# Patient Record
Sex: Female | Born: 1990 | Race: White | Hispanic: No | Marital: Single | Smoking: Former smoker
Health system: Southern US, Community
[De-identification: ages and names within clinical notes are randomized; demographics above are authoritative.]

## PROBLEM LIST (undated history)

## (undated) ENCOUNTER — Inpatient Hospital Stay (HOSPITAL_COMMUNITY): Payer: Self-pay

## (undated) DIAGNOSIS — F429 Obsessive-compulsive disorder, unspecified: Secondary | ICD-10-CM

## (undated) DIAGNOSIS — N200 Calculus of kidney: Secondary | ICD-10-CM

## (undated) DIAGNOSIS — F32A Depression, unspecified: Secondary | ICD-10-CM

## (undated) DIAGNOSIS — K8020 Calculus of gallbladder without cholecystitis without obstruction: Secondary | ICD-10-CM

## (undated) DIAGNOSIS — F329 Major depressive disorder, single episode, unspecified: Secondary | ICD-10-CM

## (undated) DIAGNOSIS — F419 Anxiety disorder, unspecified: Secondary | ICD-10-CM

## (undated) HISTORY — PX: TONSILLECTOMY AND ADENOIDECTOMY: SUR1326

---

## 2015-10-11 ENCOUNTER — Encounter (HOSPITAL_COMMUNITY): Payer: Self-pay

## 2015-10-11 ENCOUNTER — Inpatient Hospital Stay (HOSPITAL_COMMUNITY)
Admission: AD | Admit: 2015-10-11 | Discharge: 2015-10-11 | Disposition: A | Discharge status: Home / Self Care | Payer: Medicaid Other | Source: Ambulatory Visit | Attending: Family Medicine | Admitting: Family Medicine

## 2015-10-11 DIAGNOSIS — Z3202 Encounter for pregnancy test, result negative: Secondary | ICD-10-CM | POA: Diagnosis not present

## 2015-10-11 DIAGNOSIS — F17210 Nicotine dependence, cigarettes, uncomplicated: Secondary | ICD-10-CM | POA: Diagnosis not present

## 2015-10-11 DIAGNOSIS — N898 Other specified noninflammatory disorders of vagina: Secondary | ICD-10-CM | POA: Diagnosis not present

## 2015-10-11 DIAGNOSIS — N6019 Diffuse cystic mastopathy of unspecified breast: Secondary | ICD-10-CM | POA: Insufficient documentation

## 2015-10-11 DIAGNOSIS — R1032 Left lower quadrant pain: Secondary | ICD-10-CM

## 2015-10-11 DIAGNOSIS — R1030 Lower abdominal pain, unspecified: Secondary | ICD-10-CM | POA: Diagnosis not present

## 2015-10-11 DIAGNOSIS — N63 Unspecified lump in breast: Secondary | ICD-10-CM | POA: Diagnosis present

## 2015-10-11 HISTORY — DX: Obsessive-compulsive disorder, unspecified: F42.9

## 2015-10-11 HISTORY — DX: Calculus of gallbladder without cholecystitis without obstruction: K80.20

## 2015-10-11 HISTORY — DX: Major depressive disorder, single episode, unspecified: F32.9

## 2015-10-11 HISTORY — DX: Anxiety disorder, unspecified: F41.9

## 2015-10-11 HISTORY — DX: Depression, unspecified: F32.A

## 2015-10-11 LAB — URINALYSIS, ROUTINE W REFLEX MICROSCOPIC
BILIRUBIN URINE: NEGATIVE
Glucose, UA: NEGATIVE mg/dL
HGB URINE DIPSTICK: NEGATIVE
Ketones, ur: NEGATIVE mg/dL
Leukocytes, UA: NEGATIVE
NITRITE: NEGATIVE
PROTEIN: NEGATIVE mg/dL
SPECIFIC GRAVITY, URINE: 1.01 (ref 1.005–1.030)
pH: 5.5 (ref 5.0–8.0)

## 2015-10-11 LAB — WET PREP, GENITAL
Clue Cells Wet Prep HPF POC: NONE SEEN
Sperm: NONE SEEN
Trich, Wet Prep: NONE SEEN
Yeast Wet Prep HPF POC: NONE SEEN

## 2015-10-11 LAB — POCT PREGNANCY, URINE: PREG TEST UR: NEGATIVE

## 2015-10-11 NOTE — MAU Note (Signed)
Sharp LLQ pain & lower abd pressure x 2 weeks, is getting worse.  Also feels "leaking sensation", unsure if she is voiding or if this is vaginal discharge.  Lump in L breast, is sore.

## 2015-10-11 NOTE — MAU Provider Note (Signed)
History     CSN: [id number]  Arrival date and time: 10/11/15 0917   First Provider Initiated Contact with Patient 10/11/15 [phone]      Chief Complaint  Patient presents with  . Abdominal Pain  . Breast Mass   HPI  Krystal Garrett 24 y.o. G1P1000 presents to the MAU with c/o of a small breast lump that she noticed yesterday and was concerned. Denies ever noticing any before. She also is complaining of lower abdominal pressure and vaginal discharge accompanied by occasional left LLQ sharp pain that has been happening for 3 weeks. She just got her insurance card so that is why she came in today.  Past Medical History  Diagnosis Date  . Gallstone   . Anxiety   . Depression   . OCD (obsessive compulsive disorder)     Past Surgical History  Procedure Laterality Date  . Tonsillectomy and adenoidectomy      as a child    Family History  Problem Relation Age of Onset  . Hypertension Mother   . Diabetes Mother   . Hypertension Father   . Cancer Maternal Grandmother   . Diabetes Maternal Grandmother   . Hypertension Maternal Grandmother     Social History  Substance Use Topics  . Smoking status: Current Every Day Smoker -- 0.50 packs/day    Types: Cigarettes  . Smokeless tobacco: Never Used  . Alcohol Use: No    Allergies: No Known Allergies  No prescriptions prior to admission    Review of Systems  Constitutional: Negative for fever.  Gastrointestinal: Positive for abdominal pain.  Genitourinary: Positive for dysuria.       Vaginal discharge  All other systems reviewed and are negative.  Physical Exam   Blood pressure 114/73, pulse 86, temperature 98.1 F (36.7 C), resp. rate 18, height 5\' 6"  (1.676 m), weight 63.504 kg (140 lb), last menstrual period 09/15/2015.  Physical Exam  Nursing note and vitals reviewed. Constitutional: She is oriented to person, place, and time. She appears well-developed and well-nourished. No distress.  HENT:  Head: Normocephalic  and atraumatic.  Cardiovascular: Normal rate.   Respiratory: Effort normal and breath sounds normal. No respiratory distress. Left breast exhibits tenderness.    Small  0.5 breast nodule probably fibrocystic breast  GI: Soft. There is tenderness.  Suprapubic   Genitourinary: Vaginal discharge found.  Clear discharge  Musculoskeletal: Normal range of motion.  Neurological: She is alert and oriented to person, place, and time.  Skin: Skin is warm and dry.  Psychiatric: She has a normal mood and affect. Her behavior is normal. Judgment and thought content normal.   Results for orders placed or performed during the hospital encounter of 10/11/15 (from the past 24 hour(s))  Urinalysis, Routine w reflex microscopic (not at [facility])     Status: None   Collection Time: 10/11/15  9:30 AM  Result Value Ref Range   Color, Urine YELLOW YELLOW   APPearance CLEAR CLEAR   Specific Gravity, Urine 1.010 1.005 - 1.030   pH 5.5 5.0 - 8.0   Glucose, UA NEGATIVE NEGATIVE mg/dL   Hgb urine dipstick NEGATIVE NEGATIVE   Bilirubin Urine NEGATIVE NEGATIVE   Ketones, ur NEGATIVE NEGATIVE mg/dL   Protein, ur NEGATIVE NEGATIVE mg/dL   Nitrite NEGATIVE NEGATIVE   Leukocytes, UA NEGATIVE NEGATIVE  Wet prep, genital     Status: Abnormal   Collection Time: 10/11/15  9:55 AM  Result Value Ref Range   Yeast Wet Prep HPF POC NONE  SEEN NONE SEEN   Trich, Wet Prep NONE SEEN NONE SEEN   Clue Cells Wet Prep HPF POC NONE SEEN NONE SEEN   WBC, Wet Prep HPF POC FEW (A) NONE SEEN   Sperm NONE SEEN   Pregnancy, urine POC     Status: None   Collection Time: 10/11/15  9:57 AM  Result Value Ref Range   Preg Test, Ur NEGATIVE NEGATIVE   MAU Course  Procedures  MDM All labs normal today will wait for GC /CH results and treat appropriately if needed. Told to monitor breast nodule and if persists she will need to follow up with obgyn. She is agreeable to recommendation. Told her that it was probably fibrocystic. Assessment  and Plan  Vaginal discharge Lower abdominal pain Fibrocystic Breast   Discharge   Krystal Garrett 10/11/2015, 10:01 AM

## 2015-10-11 NOTE — Discharge Instructions (Signed)
Chlamydia, Female Chlamydia is an infection. It is spread from one person to another person during sexual contact. This infection can be in the cervix, urine tube (urethra), throat, or bottom (rectum). This infection needs treatment. HOME CARE   Take your medicines (antibiotics) as told. Finish them even if you start to feel better.  Only take medicine as told by your doctor.  Tell your sex partner(s) that you have chlamydia. They must also be treated.  Do not have sex until your doctor says it is okay.  Rest.  Eat healthy. Drink enough fluids to keep your pee (urine) clear or pale yellow.  Keep all doctor visits as told. GET HELP IF:  You have pain when you pee.  You have belly pain.  You have vaginal discharge.  You have pain during sex.  You have bleeding between periods and after sex.  You have a fever. GET HELP RIGHT AWAY IF:   You feel sick to your stomach (nauseous) or you throw up (vomit).  You sweat much more than normal (diaphoresis).  You have trouble swallowing.   This information is not intended to replace advice given to you by your health care provider. Make sure you discuss any questions you have with your health care provider.   Document Released: 07/31/2008 Document Revised: 07/13/2015 Document Reviewed: 06/29/2013 Elsevier Interactive Patient Education 2016 [place] Gonorrhea is a bacterial infection that spreads from person to person through sexual contact. It can also spread from a mother to her baby during childbirth. You may be tested for gonorrhea if:  You have symptoms of gonorrhea.  You are pregnant or plan to get pregnant.  You have multiple sexual partners. There are three gonorrhea tests:  Nucleic acid amplification test. For this test, you will provide a urine sample or your health care provider will use a swab to collect a fluid sample from the area of possible infection.  Nucleic acid hybridization test. For  this test, your health care provider will use a swab to collect a fluid sample from the penis or vagina.  Gonorrhea culture. For this test, your health care provider will use a swab to collect a sample of bodily fluid from the area of possible infection. This test may be done in cases of sexual assault for legal reasons. In all three methods of testing, the sample is sent to a laboratory for analysis. PREPARATION FOR TEST  Let your health care provider know if you are taking antibiotic medicine.  Do not use vaginal creams or douches.  Try to arrive with a full bladder. RESULTS It is your responsibility to obtain your test results. Ask the lab or department performing the test when and how you will get your results. Contact your health care provider to discuss any questions you have about your results. The results of your gonorrhea test will either be negative or positive. Meaning of Negative Test Results If your test result is negative, then it is most likely that you do not have the disease. Meaning of Positive Test Results If your test result is positive, you have an active gonorrhea infection. Notify any sexual partners so that they can also be tested.   This information is not intended to replace advice given to you by your health care provider. Make sure you discuss any questions you have with your health care provider.   Document Released: 11/23/2004 Document Revised: 11/12/2014 Document Reviewed: 01/25/2014 Elsevier Interactive Patient Education 2016 [employer] Fibrocystic Breast Changes Fibrocystic  breast changes happen when tiny sacs filled with fluid form in the breast. They are not cancer. They can feel like lumps. HOME CARE  Check your breasts after every menstrual period or the first day of every month if you do not have menstrual periods. Check for:  Soreness.  New puffiness (swelling).  A change in breast size.  A change in a lump that was already there.  Only  take medicine as told by your doctor.  Wear a support or sports bra that fits well.  Avoid caffeine in pop, chocolate, coffee, and tea. GET HELP IF:   You have fluid coming from your nipples, especially if it is bloody.  You have new lumps or bumps in your breast.  Your breast becomes puffy, red, and painful.  You have changes in how your breast looks.  Your nipples look flat or sunk in.   This information is not intended to replace advice given to you by your health care provider. Make sure you discuss any questions you have with your health care provider.   Document Released: 10/04/2008 Document Revised: 10/27/2013 Document Reviewed: 04/12/2013 Elsevier Interactive Patient Education [employer].

## 2015-10-12 LAB — GC/CHLAMYDIA PROBE AMP (~~LOC~~) NOT AT ARMC
Chlamydia: NEGATIVE
Neisseria Gonorrhea: NEGATIVE

## 2015-10-19 ENCOUNTER — Emergency Department (HOSPITAL_COMMUNITY): Payer: Medicaid Other

## 2015-10-19 ENCOUNTER — Emergency Department (HOSPITAL_COMMUNITY)
Admission: EM | Admit: 2015-10-19 | Discharge: 2015-10-19 | Disposition: A | Discharge status: Home / Self Care | Payer: Medicaid Other | Attending: Emergency Medicine | Admitting: Emergency Medicine

## 2015-10-19 ENCOUNTER — Encounter (HOSPITAL_COMMUNITY): Payer: Self-pay | Admitting: Emergency Medicine

## 2015-10-19 DIAGNOSIS — F17210 Nicotine dependence, cigarettes, uncomplicated: Secondary | ICD-10-CM | POA: Insufficient documentation

## 2015-10-19 DIAGNOSIS — R319 Hematuria, unspecified: Secondary | ICD-10-CM

## 2015-10-19 DIAGNOSIS — R42 Dizziness and giddiness: Secondary | ICD-10-CM | POA: Diagnosis not present

## 2015-10-19 DIAGNOSIS — Z8659 Personal history of other mental and behavioral disorders: Secondary | ICD-10-CM | POA: Insufficient documentation

## 2015-10-19 DIAGNOSIS — Z8719 Personal history of other diseases of the digestive system: Secondary | ICD-10-CM | POA: Insufficient documentation

## 2015-10-19 DIAGNOSIS — Z3202 Encounter for pregnancy test, result negative: Secondary | ICD-10-CM | POA: Diagnosis not present

## 2015-10-19 DIAGNOSIS — N200 Calculus of kidney: Secondary | ICD-10-CM | POA: Diagnosis not present

## 2015-10-19 DIAGNOSIS — R112 Nausea with vomiting, unspecified: Secondary | ICD-10-CM | POA: Diagnosis present

## 2015-10-19 LAB — COMPREHENSIVE METABOLIC PANEL
ALT: 13 U/L — ABNORMAL LOW (ref 14–54)
AST: 16 U/L (ref 15–41)
Albumin: 4.3 g/dL (ref 3.5–5.0)
Alkaline Phosphatase: 56 U/L (ref 38–126)
Anion gap: 9 (ref 5–15)
BUN: 8 mg/dL (ref 6–20)
CO2: 24 mmol/L (ref 22–32)
Calcium: 9.1 mg/dL (ref 8.9–10.3)
Chloride: 106 mmol/L (ref 101–111)
Creatinine, Ser: 0.92 mg/dL (ref 0.44–1.00)
GFR calc Af Amer: >60 mL/min (ref >60)
GFR calc non Af Amer: >60 mL/min (ref >60)
Glucose, Bld: 108 mg/dL — ABNORMAL HIGH (ref 65–99)
Potassium: 3.7 mmol/L (ref 3.5–5.1)
Sodium: 139 mmol/L (ref 135–145)
Total Bilirubin: 0.5 mg/dL (ref 0.3–1.2)
Total Protein: 6.5 g/dL (ref 6.5–8.1)

## 2015-10-19 LAB — URINALYSIS, ROUTINE W REFLEX MICROSCOPIC
GLUCOSE, UA: NEGATIVE mg/dL
Ketones, ur: 15 mg/dL — AB
Nitrite: POSITIVE — AB
PH: 5 (ref 5.0–8.0)
Protein, ur: 100 mg/dL — AB
SPECIFIC GRAVITY, URINE: 1.036 — AB (ref 1.005–1.030)

## 2015-10-19 LAB — CBC
HCT: 41.4 % (ref 36.0–46.0)
Hemoglobin: 14.1 g/dL (ref 12.0–15.0)
MCH: 31.4 pg (ref 26.0–34.0)
MCHC: 34.1 g/dL (ref 30.0–36.0)
MCV: 92.2 fL (ref 78.0–100.0)
Platelets: 301 10*3/uL (ref 150–400)
RBC: 4.49 MIL/uL (ref 3.87–5.11)
RDW: 12.4 % (ref 11.5–15.5)
WBC: 9.6 10*3/uL (ref 4.0–10.5)

## 2015-10-19 LAB — URINE MICROSCOPIC-ADD ON: Bacteria, UA: NONE SEEN

## 2015-10-19 LAB — POC URINE PREG, ED: Preg Test, Ur: NEGATIVE

## 2015-10-19 MED ORDER — ONDANSETRON 4 MG PO TBDP
4.0000 mg | ORAL_TABLET | Freq: Once | ORAL | Status: AC
  Administered 2015-10-19: 4 mg via ORAL
  Filled 2015-10-19: qty 1

## 2015-10-19 MED ORDER — HYDROCODONE-ACETAMINOPHEN 5-325 MG PO TABS: 1.0000 | ORAL_TABLET | Freq: Four times a day (QID) | ORAL | Status: DC | PRN

## 2015-10-19 MED ORDER — FENTANYL CITRATE (PF) 100 MCG/2ML IJ SOLN
50.0000 ug | Freq: Once | INTRAMUSCULAR | Status: AC
  Administered 2015-10-19: 50 ug via INTRAMUSCULAR
  Filled 2015-10-19: qty 2

## 2015-10-19 MED ORDER — ONDANSETRON 4 MG PO TBDP: 4.0000 mg | ORAL_TABLET | Freq: Three times a day (TID) | ORAL | Status: DC | PRN

## 2015-10-19 MED ORDER — FOSFOMYCIN TROMETHAMINE 3 G PO PACK
3.0000 g | PACK | Freq: Once | ORAL | Status: AC
  Administered 2015-10-19: 3 g via ORAL
  Filled 2015-10-19: qty 3

## 2015-10-19 NOTE — ED Provider Notes (Signed)
I saw and evaluated the patient, reviewed the resident's note and I agree with the findings and plan.  Back pain and abdominal pain similar to previous kidney stones. Appears well. Abdomen benign, doubt intraabdominal pathology. Plan to work up for nephrolithiasis.   Merrily Pew, MD 10/19/15 [phone]

## 2015-10-19 NOTE — ED Provider Notes (Signed)
CSN: [id number]     Arrival date & time 10/19/15  1929 History   First MD Initiated Contact with Patient 10/19/15 2112     Chief Complaint  Patient presents with  . Emesis  . Dizziness    Patient is a 24 y.o. female presenting with vomiting. The history is provided by the patient.  Emesis Severity:  Moderate Duration:  2 days Timing:  Constant Progression:  Worsening Chronicity:  New Relieved by:  Nothing Associated symptoms: abdominal pain   Associated symptoms: no fever, no headaches and no sore throat   Associated symptoms comment:  Nausea and lightheadedness   Past Medical History  Diagnosis Date  . Gallstone   . Anxiety   . Depression   . OCD (obsessive compulsive disorder)    Past Surgical History  Procedure Laterality Date  . Tonsillectomy and adenoidectomy      as a child   Family History  Problem Relation Age of Onset  . Hypertension Mother   . Diabetes Mother   . Hypertension Father   . Cancer Maternal Grandmother   . Diabetes Maternal Grandmother   . Hypertension Maternal Grandmother    Social History  Substance Use Topics  . Smoking status: Current Every Day Smoker -- 0.00 packs/day    Types: Cigarettes  . Smokeless tobacco: Never Used  . Alcohol Use: No   OB History    Gravida Para Term Preterm AB TAB SAB Ectopic Multiple Living   1 1 1             Review of Systems  Constitutional: Negative for fever.  HENT: Negative for rhinorrhea and sore throat.   Eyes: Negative for visual disturbance.  Respiratory: Negative for chest tightness and shortness of breath.   Cardiovascular: Negative for chest pain and palpitations.  Gastrointestinal: Positive for nausea, vomiting and abdominal pain. Negative for constipation.  Genitourinary: Positive for flank pain and vaginal bleeding (currently menstruating). Negative for dysuria and hematuria.  Musculoskeletal: Negative for back pain and neck pain.  Skin: Negative for rash.  Neurological: Positive for  light-headedness. Negative for dizziness and headaches.  Psychiatric/Behavioral: Negative for confusion.  All other systems reviewed and are negative.  Allergies  Review of patient's allergies indicates no known allergies.  Home Medications   Prior to Admission medications   Medication Sig Start Date End Date Taking? Authorizing Provider  Melatonin 3 MG TABS Take 3 mg by mouth at bedtime as needed (sleep).   Yes Historical Provider, MD  HYDROcodone-acetaminophen (NORCO/VICODIN) 5-325 MG tablet Take 1 tablet by mouth every 6 (six) hours as needed for moderate pain. 10/19/15   Gustavus Bryant, MD  ondansetron (ZOFRAN ODT) 4 MG disintegrating tablet Take 1 tablet (4 mg total) by mouth every 8 (eight) hours as needed for nausea or vomiting. 10/19/15   Gustavus Bryant, MD   BP 115/77 mmHg  Pulse 71  Temp(Src) 98.4 F (36.9 C) (Oral)  Resp 16  SpO2 100%  LMP 10/12/2015 Physical Exam  Constitutional: She is oriented to person, place, and time. She appears well-developed and well-nourished. No distress.  HENT:  Head: Normocephalic and atraumatic.  Mouth/Throat: Oropharynx is clear and moist.  Eyes: EOM are normal. Pupils are equal, round, and reactive to light.  Neck: Neck supple. No JVD present.  Cardiovascular: Normal rate, regular rhythm, normal heart sounds and intact distal pulses.  Exam reveals no gallop.   No murmur heard. Pulmonary/Chest: Effort normal and breath sounds normal. She has no wheezes. She has no rales.  Abdominal: Soft. She exhibits no distension. There is tenderness in the suprapubic area. There is CVA tenderness (R>L).  Musculoskeletal: Normal range of motion. She exhibits no tenderness.  Neurological: She is alert and oriented to person, place, and time. No cranial nerve deficit. She exhibits normal muscle tone.  Skin: Skin is warm and dry. No rash noted.  Psychiatric: Her behavior is normal.    ED Course  Procedures none  Labs Review Labs Reviewed   COMPREHENSIVE METABOLIC PANEL - Abnormal; Notable for the following:    Glucose, Bld 108 (*)    ALT 13 (*)    All other components within normal limits  URINALYSIS, ROUTINE W REFLEX MICROSCOPIC (NOT AT [facility]) - Abnormal; Notable for the following:    Color, Urine RED (*)    APPearance TURBID (*)    Specific Gravity, Urine 1.036 (*)    Hgb urine dipstick LARGE (*)    Bilirubin Urine MODERATE (*)    Ketones, ur 15 (*)    Protein, ur 100 (*)    Nitrite POSITIVE (*)    Leukocytes, UA SMALL (*)    All other components within normal limits  URINE MICROSCOPIC-ADD ON - Abnormal; Notable for the following:    Squamous Epithelial / LPF 0-5 (*)    Crystals CA OXALATE CRYSTALS (*)    All other components within normal limits  CBC  POC URINE PREG, ED    Imaging Review Ct Renal Stone Study  10/19/2015  CLINICAL DATA:  Abdominal pain. Vomiting. Hematuria. History kidney stones. EXAM: CT ABDOMEN AND PELVIS WITHOUT CONTRAST TECHNIQUE: Multidetector CT imaging of the abdomen and pelvis was performed following the standard protocol without IV contrast. COMPARISON:  None. FINDINGS: Lower chest: Clear lung bases. Normal heart size without pericardial or pleural effusion. Hepatobiliary: Normal liver. Normal gallbladder, without biliary ductal dilatation. Pancreas: Normal, without mass or ductal dilatation. Spleen: Normal in size, without focal abnormality. Adrenals/Urinary Tract: Normal adrenal glands. Malrotated left kidney. Bilateral renal collecting system calculi. No hydronephrosis. Calcifications in the pelvis which are all felt to be vascular. No right and no convincing evidence of left ureteric stent. No bladder calculi. Stomach/Bowel: Normal stomach, without wall thickening. Normal colon, appendix, and terminal ileum. Normal small bowel. Vascular/Lymphatic: Normal caliber of the aorta and branch vessels. No pelvic adenopathy. Reproductive: Normal uterus and adnexa. Other: No significant free fluid.  Musculoskeletal: No acute osseous abnormality. IMPRESSION: 1. Bilateral nephrolithiasis. Malrotated left kidney. No obstructive uropathy and no convincing evidence of ureteric stone. Multiple vascular calcifications throughout the pelvis. 2. No other explanation for abdominal pain. Electronically Signed   By: Abigail Miyamoto M.D.   On: 10/19/2015 22:28   I have personally reviewed and evaluated these images and lab results as part of my medical decision-making.   MDM   Final diagnoses:  Hematuria  Nephrolithiasis    24 year old female with a history of kidney stones and UTIs presents with emesis, dizziness. Onset yesterday. UA shows Large blood with +nitrites and no bacteria. UPT neg. H/o gallstones. Ca oxalate crystals on UA. History of remote kidney stones and UTIs.  She is symptomatic with some renal urination. We'll give fosfomycin here and obtain CT without contrast to evaluate for stone. IM fentanyl and ODT Zofran.  CT shows bilateral nephrolithiasis without evidence of infection or hydronephrosis. We will discharge home with prescription for Zofran, Norco and contact information to follow up with urology. Patient wishes understanding and is agreeable with this plan. She ran C mechanically stable and her symptoms have  improved in the ED.   Discussed with Dr. Dayna Barker.   Gustavus Bryant, MD 10/19/15 [id number]  Merrily Pew, MD 10/20/15 [phone]

## 2015-10-19 NOTE — ED Notes (Signed)
Patient transported to CT 

## 2015-10-19 NOTE — ED Notes (Signed)
Pt. reports nausea , emesis and dizziness " blacking out" onset this afternoon , denies fever or diarrhea .

## 2015-10-19 NOTE — ED Notes (Signed)
Pt c/o NV with lower abdominal cramping onset today. Denies urinary problems. Hx of kidney stones in the past

## 2016-08-12 ENCOUNTER — Encounter (HOSPITAL_COMMUNITY): Payer: Self-pay | Admitting: Emergency Medicine

## 2016-08-12 ENCOUNTER — Emergency Department (HOSPITAL_COMMUNITY): Payer: Medicaid Other

## 2016-08-12 ENCOUNTER — Emergency Department (HOSPITAL_COMMUNITY)
Admission: EM | Admit: 2016-08-12 | Discharge: 2016-08-13 | Disposition: A | Discharge status: Home / Self Care | Payer: Medicaid Other | Attending: Emergency Medicine | Admitting: Emergency Medicine

## 2016-08-12 DIAGNOSIS — R109 Unspecified abdominal pain: Secondary | ICD-10-CM

## 2016-08-12 DIAGNOSIS — R1011 Right upper quadrant pain: Secondary | ICD-10-CM | POA: Diagnosis not present

## 2016-08-12 DIAGNOSIS — F17210 Nicotine dependence, cigarettes, uncomplicated: Secondary | ICD-10-CM | POA: Insufficient documentation

## 2016-08-12 DIAGNOSIS — R112 Nausea with vomiting, unspecified: Secondary | ICD-10-CM

## 2016-08-12 HISTORY — DX: Calculus of kidney: N20.0

## 2016-08-12 LAB — CBC
HEMATOCRIT: 41.1 % (ref 36.0–46.0)
HEMOGLOBIN: 13.4 g/dL (ref 12.0–15.0)
MCH: 31 pg (ref 26.0–34.0)
MCHC: 32.6 g/dL (ref 30.0–36.0)
MCV: 95.1 fL (ref 78.0–100.0)
Platelets: 300 10*3/uL (ref 150–400)
RBC: 4.32 MIL/uL (ref 3.87–5.11)
RDW: 12.3 % (ref 11.5–15.5)
WBC: 8.3 10*3/uL (ref 4.0–10.5)

## 2016-08-12 LAB — URINALYSIS, ROUTINE W REFLEX MICROSCOPIC
BILIRUBIN URINE: NEGATIVE
Glucose, UA: NEGATIVE mg/dL
Hgb urine dipstick: NEGATIVE
Ketones, ur: NEGATIVE mg/dL
NITRITE: NEGATIVE
PH: 6.5 (ref 5.0–8.0)
Protein, ur: NEGATIVE mg/dL

## 2016-08-12 LAB — URINE MICROSCOPIC-ADD ON

## 2016-08-12 LAB — COMPREHENSIVE METABOLIC PANEL
ALBUMIN: 4 g/dL (ref 3.5–5.0)
ALT: 15 U/L (ref 14–54)
ANION GAP: 7 (ref 5–15)
AST: 19 U/L (ref 15–41)
Alkaline Phosphatase: 48 U/L (ref 38–126)
BILIRUBIN TOTAL: 0.4 mg/dL (ref 0.3–1.2)
BUN: 9 mg/dL (ref 6–20)
CO2: 28 mmol/L (ref 22–32)
Calcium: 9.3 mg/dL (ref 8.9–10.3)
Chloride: 106 mmol/L (ref 101–111)
Creatinine, Ser: 0.92 mg/dL (ref 0.44–1.00)
GFR calc Af Amer: >60 mL/min (ref >60)
GFR calc non Af Amer: >60 mL/min (ref >60)
GLUCOSE: 82 mg/dL (ref 65–99)
POTASSIUM: 4.1 mmol/L (ref 3.5–5.1)
SODIUM: 141 mmol/L (ref 135–145)
TOTAL PROTEIN: 6.2 g/dL — AB (ref 6.5–8.1)

## 2016-08-12 LAB — LIPASE, BLOOD: LIPASE: 45 U/L (ref 11–51)

## 2016-08-12 LAB — POC URINE PREG, ED: Preg Test, Ur: NEGATIVE

## 2016-08-12 MED ORDER — KETOROLAC TROMETHAMINE 30 MG/ML IJ SOLN
30.0000 mg | Freq: Once | INTRAMUSCULAR | Status: AC
  Administered 2016-08-12: 30 mg via INTRAVENOUS
  Filled 2016-08-12: qty 1

## 2016-08-12 MED ORDER — SODIUM CHLORIDE 0.9 % IV BOLUS (SEPSIS)
1000.0000 mL | Freq: Once | INTRAVENOUS | Status: AC
  Administered 2016-08-12: 1000 mL via INTRAVENOUS

## 2016-08-12 MED ORDER — ONDANSETRON HCL 4 MG/2ML IJ SOLN
4.0000 mg | Freq: Once | INTRAMUSCULAR | Status: AC
  Administered 2016-08-12: 4 mg via INTRAVENOUS
  Filled 2016-08-12: qty 2

## 2016-08-12 NOTE — ED Triage Notes (Addendum)
C/o RUQ pain and bilateral flank pain since Saturday.  Reports "knot" on R flank that she noticed today.  Also c/o nausea, vomiting, dysuria and dark urine.  Took Zofran pta with relief of nausea.

## 2016-08-12 NOTE — ED Notes (Signed)
Pt returned from ultrasound

## 2016-08-12 NOTE — ED Provider Notes (Signed)
[facility] DEPT Provider Note   CSN: [id number] Arrival date & time: 08/12/16  1926     History   Chief Complaint Chief Complaint  Patient presents with  . Abdominal Pain  . Flank Pain    HPI Krystal Garrett is a 25 y.o. female.  25 year old female with a history of kidney stones, gallstones, anxiety, and depression presents to the emergency department for evaluation of abdominal pain. Patient reports abdominal pain began yesterday and has been waxing and waning in severity. She has noticed pain to her right upper quadrant as well as to the right side of her back. She believes that there is a "knot" to her right flank as well. Patient states that pain is sometimes worse with movement. She has noted some lightheadedness with acute worsening of her pain. Patient reporting nausea and vomiting as well. Emesis has been nonbloody and nausea improved with Zofran. Patient denies any hematuria, though she does state her urine yesterday was darker than normal. She has no history of abdominal surgeries. No bloody bowel movements, vaginal bleeding, or vaginal discharge. No associated fevers.     Past Medical History:  Diagnosis Date  . Anxiety   . Depression   . Gallstone   . Kidney stone   . OCD (obsessive compulsive disorder)     There are no active problems to display for this patient.   Past Surgical History:  Procedure Laterality Date  . TONSILLECTOMY AND ADENOIDECTOMY     as a child    OB History    Gravida Para Term Preterm AB Living   1 1 1          SAB TAB Ectopic Multiple Live Births                   Home Medications    Prior to Admission medications   Medication Sig Start Date End Date Taking? Authorizing Provider  Multiple Vitamin (THERA) TABS Take 1 tablet by mouth daily.   Yes Historical Provider, MD  naproxen sodium (ANAPROX) 220 MG tablet Take 440 mg by mouth daily as needed (for pain).   Yes Historical Provider, MD  dicyclomine (BENTYL) 20 MG tablet  Take 1 tablet (20 mg total) by mouth 2 (two) times daily. 08/13/16   Antonietta Breach, PA-C  naproxen (NAPROSYN) 500 MG tablet Take 1 tablet (500 mg total) by mouth 2 (two) times daily. 08/13/16   Antonietta Breach, PA-C  promethazine (PHENERGAN) 25 MG tablet Take 1 tablet (25 mg total) by mouth every 6 (six) hours as needed for nausea or vomiting. 08/13/16   Antonietta Breach, PA-C    Family History Family History  Problem Relation Age of Onset  . Hypertension Mother   . Diabetes Mother   . Hypertension Father   . Cancer Maternal Grandmother   . Diabetes Maternal Grandmother   . Hypertension Maternal Grandmother     Social History Social History  Substance Use Topics  . Smoking status: Current Every Day Smoker    Packs/day: 0.00    Types: Cigarettes  . Smokeless tobacco: Never Used  . Alcohol use No     Allergies   Review of patient's allergies indicates no known allergies.   Review of Systems Review of Systems Ten systems reviewed and are negative for acute change, except as noted in the HPI.    Physical Exam Updated Vital Signs BP 116/71 (BP Location: Right Arm)   Pulse 84   Temp 98.4 F (36.9 C) (Oral)   Resp 20  Ht 5\' 6"  (1.676 m)   Wt 64.4 kg   LMP 07/29/2016 (Exact Date)   SpO2 99%   BMI 22.92 kg/m   Physical Exam  Constitutional: She is oriented to person, place, and time. She appears well-developed and well-nourished. No distress.  Nontoxic appearing and in no distress.  HENT:  Head: Normocephalic and atraumatic.  Eyes: Conjunctivae and EOM are normal. No scleral icterus.  Neck: Normal range of motion.  Cardiovascular: Normal rate, regular rhythm and intact distal pulses.   Pulmonary/Chest: Effort normal. No respiratory distress. She has no wheezes.  Respirations even and unlabored.  Abdominal: Soft. She exhibits no distension and no mass. There is no guarding.  Mild tenderness in the right upper quadrant and right lower quadrant. Negative Murphy sign. No masses. No  voluntary or involuntary guarding. No peritoneal signs or distention. Abdomen soft.  Musculoskeletal: Normal range of motion.  Mild appreciable spasm to the right lumbar paraspinal muscles. No tenderness to palpation to the lumbosacral midline. No bony deformities, step-offs, or crepitus.  Neurological: She is alert and oriented to person, place, and time. She exhibits normal muscle tone. Coordination normal.  Skin: Skin is warm and dry. No rash noted. She is not diaphoretic. No erythema. No pallor.  Psychiatric: She has a normal mood and affect. Her behavior is normal.  Nursing note and vitals reviewed.    ED Treatments / Results  Labs (all labs ordered are listed, but only abnormal results are displayed) Labs Reviewed  COMPREHENSIVE METABOLIC PANEL - Abnormal; Notable for the following:       Result Value   Total Protein 6.2 (*)    All other components within normal limits  URINALYSIS, ROUTINE W REFLEX MICROSCOPIC (NOT AT [facility]) - Abnormal; Notable for the following:    APPearance CLOUDY (*)    Specific Gravity, Urine <1.005 (*)    Leukocytes, UA SMALL (*)    All other components within normal limits  URINE MICROSCOPIC-ADD ON - Abnormal; Notable for the following:    Squamous Epithelial / LPF 6-30 (*)    Bacteria, UA RARE (*)    All other components within normal limits  LIPASE, BLOOD  CBC  POC URINE PREG, ED    EKG  EKG Interpretation None       Radiology US Renal  Result Date: 08/12/2016 CLINICAL DATA:  Right flank pain. EXAM: RENAL / URINARY TRACT ULTRASOUND COMPLETE COMPARISON:  CT abdomen pelvis 10/19/2015. FINDINGS: Right Kidney: Length: 9.4 cm. Echogenicity within normal limits. No mass or hydronephrosis visualized. Left Kidney: Length: 13.3 cm. Echogenicity within normal limits. No mass or hydronephrosis visualized. There is a suspected duplicated collecting system. Bladder: Appears normal for degree of bladder distention. IMPRESSION: No nephrolithiasis or  hydronephrosis. Electronically Signed   By: Ulyses Jarred M.D.   On: 08/12/2016 23:48   US Abdomen Limited Ruq  Result Date: 08/12/2016 CLINICAL DATA:  Right upper quadrant pain. Patient reports history of gallstones. EXAM: US ABDOMEN LIMITED - RIGHT UPPER QUADRANT COMPARISON:  And CT 10/19/2015 FINDINGS: Gallbladder: Only minimally distended, technologist states patient is not NPO. No gallstones visualized. No wall thickening allowing for degree of distension. No sonographic Murphy sign noted by sonographer. Common bile duct: Diameter: 2 mm proximally, 4 mm distally. Liver: No focal lesion identified. Within normal limits in parenchymal echogenicity. Normal directional flow in the main portal vein. IMPRESSION: No gallstones or findings of acute cholecystitis. Electronically Signed   By: Jeb Levering M.D.   On: 08/12/2016 23:44  Procedures Procedures (including critical care time)  Medications Ordered in ED Medications  ondansetron (ZOFRAN) injection 4 mg (4 mg Intravenous Given 08/12/16 2233)  sodium chloride 0.9 % bolus 1,000 mL (0 mLs Intravenous Stopped 08/13/16 0007)  ketorolac (TORADOL) 30 MG/ML injection 30 mg (30 mg Intravenous Given 08/12/16 2234)     Initial Impression / Assessment and Plan / ED Course  I have reviewed the triage vital signs and the nursing notes.  Pertinent labs & imaging results that were available during my care of the patient were reviewed by me and considered in my medical decision making (see chart for details).  Clinical Course    25 year old female presents to the emergency department for evaluation of left flank pain and left-sided abdominal pain with nausea and vomiting. Initial abdominal exam was reassuring; no evidence of acute surgical abdomen. Patient does state that symptoms all consistent of prior kidney stones. She has no hematuria today and a preserved kidney function. Ultrasound shows no evidence of hydronephrosis or stones. Low suspicion for  kidney stones given workup.  Patient also reports that she has a history of gallstones. An ultrasound of the gallbladder was performed given location of pain. This shows no evidence of gallstones or acute cholecystitis. Patient has normal liver function tests today.  No evidence of fever or leukocytosis to suggest infectious etiology. No evidence of UTI. Pregnancy negative. I recommended further evaluation with a pelvic exam which patient declines. Mother was very agitated and upset about the care that her daughter received. She exclaims, "What did you give her for pain? Whatever it was made here more nauseous and have more pain. She can just go home and be miserable."   All results reviewed with the patient and her mother. Plan was for discharge with prescriptions for supportive care; however, patient and mother eloped from the department prior to official discharge; no prescriptions received. Patient pulled her IV out and it was found in the trash.   Final Clinical Impressions(s) / ED Diagnoses   Final diagnoses:  Acute right flank pain  Nausea and vomiting, intractability of vomiting not specified, unspecified vomiting type    New Prescriptions New Prescriptions   DICYCLOMINE (BENTYL) 20 MG TABLET    Take 1 tablet (20 mg total) by mouth 2 (two) times daily.   NAPROXEN (NAPROSYN) 500 MG TABLET    Take 1 tablet (500 mg total) by mouth 2 (two) times daily.   PROMETHAZINE (PHENERGAN) 25 MG TABLET    Take 1 tablet (25 mg total) by mouth every 6 (six) hours as needed for nausea or vomiting.     Antonietta Breach, PA-C 08/13/16 [place], PA-C 08/13/16 [place], MD 08/13/16 [phone]

## 2016-08-12 NOTE — ED Notes (Signed)
Pt to ultrasound

## 2016-08-12 NOTE — ED Notes (Signed)
Pt c/o upper and lower abd pain,  Describes as pressure

## 2016-08-13 MED ORDER — PROMETHAZINE HCL 25 MG PO TABS: 25.0000 mg | ORAL_TABLET | Freq: Four times a day (QID) | ORAL | 0 refills | Status: DC | PRN

## 2016-08-13 MED ORDER — DICYCLOMINE HCL 20 MG PO TABS: 20.0000 mg | ORAL_TABLET | Freq: Two times a day (BID) | ORAL | 0 refills | Status: DC

## 2016-08-13 MED ORDER — NAPROXEN 500 MG PO TABS: 500.0000 mg | ORAL_TABLET | Freq: Two times a day (BID) | ORAL | 0 refills | Status: DC

## 2016-08-13 NOTE — Discharge Instructions (Signed)
Your workup today including your labs and imaging are reassuring. You have no fever to suggest significant infection. You have declined a pelvic exam while in the emergency department. We advise a follow-up with your primary care doctor if symptoms persist. You may take Phenergan as prescribed for nausea and vomiting. Take naproxen as prescribed for pain. If you are experiencing abdominal pain that does not respond well to naproxen, you may benefit from Bentyl as this helps improve intestinal spasms. You may return to the emergency department for new or concerning symptoms.

## 2016-08-13 NOTE — ED Notes (Signed)
Pt's IV found in trash with cath intact.  Pt seen leaving the ED with her mother.  Pt left before being discharged or with her discharge instructions

## 2016-10-22 ENCOUNTER — Encounter (HOSPITAL_COMMUNITY): Payer: Self-pay | Admitting: *Deleted

## 2016-10-22 ENCOUNTER — Inpatient Hospital Stay (HOSPITAL_COMMUNITY)
Admission: AD | Admit: 2016-10-22 | Discharge: 2016-10-22 | Disposition: A | Discharge status: Home / Self Care | Payer: Medicaid Other | Source: Ambulatory Visit | Attending: Family Medicine | Admitting: Family Medicine

## 2016-10-22 DIAGNOSIS — F17210 Nicotine dependence, cigarettes, uncomplicated: Secondary | ICD-10-CM | POA: Diagnosis not present

## 2016-10-22 DIAGNOSIS — M545 Low back pain: Secondary | ICD-10-CM | POA: Insufficient documentation

## 2016-10-22 DIAGNOSIS — Z3202 Encounter for pregnancy test, result negative: Secondary | ICD-10-CM | POA: Insufficient documentation

## 2016-10-22 DIAGNOSIS — R109 Unspecified abdominal pain: Secondary | ICD-10-CM

## 2016-10-22 DIAGNOSIS — R1030 Lower abdominal pain, unspecified: Secondary | ICD-10-CM | POA: Diagnosis not present

## 2016-10-22 LAB — CBC WITH DIFFERENTIAL/PLATELET
BASOS ABS: 0 10*3/uL (ref 0.0–0.1)
BASOS PCT: 0 %
EOS ABS: 0 10*3/uL (ref 0.0–0.7)
Eosinophils Relative: 0 %
HCT: 39.6 % (ref 36.0–46.0)
HEMOGLOBIN: 13.7 g/dL (ref 12.0–15.0)
Lymphocytes Relative: 28 %
Lymphs Abs: 1.9 10*3/uL (ref 0.7–4.0)
MCH: 31.6 pg (ref 26.0–34.0)
MCHC: 34.6 g/dL (ref 30.0–36.0)
MCV: 91.5 fL (ref 78.0–100.0)
MONOS PCT: 4 %
Monocytes Absolute: 0.3 10*3/uL (ref 0.1–1.0)
NEUTROS ABS: 4.5 10*3/uL (ref 1.7–7.7)
NEUTROS PCT: 68 %
Platelets: 266 10*3/uL (ref 150–400)
RBC: 4.33 MIL/uL (ref 3.87–5.11)
RDW: 12.5 % (ref 11.5–15.5)
WBC: 6.8 10*3/uL (ref 4.0–10.5)

## 2016-10-22 LAB — URINALYSIS, ROUTINE W REFLEX MICROSCOPIC
Bilirubin Urine: NEGATIVE
GLUCOSE, UA: NEGATIVE mg/dL
Hgb urine dipstick: NEGATIVE
KETONES UR: NEGATIVE mg/dL
Nitrite: NEGATIVE
PROTEIN: NEGATIVE mg/dL
Specific Gravity, Urine: 1.004 — ABNORMAL LOW (ref 1.005–1.030)
pH: 6 (ref 5.0–8.0)

## 2016-10-22 LAB — WET PREP, GENITAL
CLUE CELLS WET PREP: NONE SEEN
SPERM: NONE SEEN
TRICH WET PREP: NONE SEEN
YEAST WET PREP: NONE SEEN

## 2016-10-22 LAB — POCT PREGNANCY, URINE: PREG TEST UR: NEGATIVE

## 2016-10-22 MED ORDER — KETOROLAC TROMETHAMINE 60 MG/2ML IM SOLN
60.0000 mg | Freq: Once | INTRAMUSCULAR | Status: AC
Start: 2016-10-22 — End: 2016-10-22
  Administered 2016-10-22: 60 mg via INTRAMUSCULAR
  Filled 2016-10-22: qty 2

## 2016-10-22 NOTE — MAU Note (Signed)
Patient states she does not think she has a virus. States it is normal for her to have diarrhea off and on. States she vomits when she feels the sharp pain. The pain started before V/D. States her reason for being here is pelvic pain and vaginal D/C.

## 2016-10-22 NOTE — MAU Provider Note (Signed)
History     CSN: [id number]  Arrival date and time: 10/22/16 [id number]   First Provider Initiated Contact with Patient 10/22/16 [phone]      Chief Complaint  Patient presents with  . Abdominal Pain  . Back Pain  . Emesis  . Diarrhea   HPI   Krystal Garrett is a 25 y.o. female G1P1001 @ Unknown 1 week ago started having lower abdominal pain and lower back pain. The ovulated at this time and thought the pain was related to that, however the pain continued. The pain currently is located in all over her abdomen. + sharp, shooting pain in the middle of her lower abdomen. She tried aleve for the pain, the last dose was yesterday and that did not help much. She has had this pain before.  Patient is not currently sexually active. Last intercourse was July 2017.   LMP is 11/25; next cycle is due 12/25  Per review of the records, patient was seen for this exact complaint 1 year ago. Patient confirmed and states she has not been able to determine why she has this "leaking".   OB History    Gravida Para Term Preterm AB Living   1 1 1     1    SAB TAB Ectopic Multiple Live Births           1      Past Medical History:  Diagnosis Date  . Anxiety   . Depression   . Gallstone   . Kidney stone   . OCD (obsessive compulsive disorder)     Past Surgical History:  Procedure Laterality Date  . TONSILLECTOMY AND ADENOIDECTOMY     as a child    Family History  Problem Relation Age of Onset  . Hypertension Mother   . Diabetes Mother   . Hypertension Father   . Cancer Maternal Grandmother   . Diabetes Maternal Grandmother   . Hypertension Maternal Grandmother     Social History  Substance Use Topics  . Smoking status: Current Every Day Smoker    Packs/day: 0.25    Types: Cigarettes  . Smokeless tobacco: Never Used  . Alcohol use No    Allergies: No Known Allergies  Prescriptions Prior to Admission  Medication Sig Dispense Refill Last Dose  . dicyclomine (BENTYL) 20 MG tablet  Take 1 tablet (20 mg total) by mouth 2 (two) times daily. 20 tablet 0   . Multiple Vitamin (THERA) TABS Take 1 tablet by mouth daily.   08/12/2016 at Unknown time  . naproxen (NAPROSYN) 500 MG tablet Take 1 tablet (500 mg total) by mouth 2 (two) times daily. 30 tablet 0   . naproxen sodium (ANAPROX) 220 MG tablet Take 440 mg by mouth daily as needed (for pain).   08/12/2016 at Unknown time  . promethazine (PHENERGAN) 25 MG tablet Take 1 tablet (25 mg total) by mouth every 6 (six) hours as needed for nausea or vomiting. 12 tablet 0    Results for orders placed or performed during the hospital encounter of 10/22/16 (from the past 48 hour(s))  Urinalysis, Routine w reflex microscopic     Status: Abnormal   Collection Time: 10/22/16  8:57 AM  Result Value Ref Range   Color, Urine STRAW (A) YELLOW   APPearance CLEAR CLEAR   Specific Gravity, Urine 1.004 (L) 1.005 - 1.030   pH 6.0 5.0 - 8.0   Glucose, UA NEGATIVE NEGATIVE mg/dL   Hgb urine dipstick NEGATIVE NEGATIVE   Bilirubin Urine  NEGATIVE NEGATIVE   Ketones, ur NEGATIVE NEGATIVE mg/dL   Protein, ur NEGATIVE NEGATIVE mg/dL   Nitrite NEGATIVE NEGATIVE   Leukocytes, UA SMALL (A) NEGATIVE   RBC / HPF 0-5 0 - 5 RBC/hpf   WBC, UA 0-5 0 - 5 WBC/hpf   Bacteria, UA RARE (A) NONE SEEN   Squamous Epithelial / LPF 0-5 (A) NONE SEEN  Pregnancy, urine POC     Status: None   Collection Time: 10/22/16  9:07 AM  Result Value Ref Range   Preg Test, Ur NEGATIVE NEGATIVE    Comment:        THE SENSITIVITY OF THIS METHODOLOGY IS >24 mIU/mL   CBC with Differential     Status: None   Collection Time: 10/22/16  9:31 AM  Result Value Ref Range   WBC 6.8 4.0 - 10.5 K/uL   RBC 4.33 3.87 - 5.11 MIL/uL   Hemoglobin 13.7 12.0 - 15.0 g/dL   HCT 39.6 36.0 - 46.0 %   MCV 91.5 78.0 - 100.0 fL   MCH 31.6 26.0 - 34.0 pg   MCHC 34.6 30.0 - 36.0 g/dL   RDW 12.5 11.5 - 15.5 %   Platelets 266 150 - 400 K/uL   Neutrophils Relative % 68 %   Neutro Abs 4.5 1.7 - 7.7  K/uL   Lymphocytes Relative 28 %   Lymphs Abs 1.9 0.7 - 4.0 K/uL   Monocytes Relative 4 %   Monocytes Absolute 0.3 0.1 - 1.0 K/uL   Eosinophils Relative 0 %   Eosinophils Absolute 0.0 0.0 - 0.7 K/uL   Basophils Relative 0 %   Basophils Absolute 0.0 0.0 - 0.1 K/uL  Wet prep, genital     Status: Abnormal   Collection Time: 10/22/16  9:52 AM  Result Value Ref Range   Yeast Wet Prep HPF POC NONE SEEN NONE SEEN   Trich, Wet Prep NONE SEEN NONE SEEN   Clue Cells Wet Prep HPF POC NONE SEEN NONE SEEN   WBC, Wet Prep HPF POC MODERATE BACTERIA SEEN (A) NONE SEEN    Comment: FEW BACTERIA SEEN   Sperm NONE SEEN     Review of Systems  Constitutional: Negative for fever.  Gastrointestinal: Positive for abdominal pain, diarrhea, nausea and vomiting.  Genitourinary: Negative for dysuria (+ Pressure) and urgency.   Physical Exam   Blood pressure 116/70, pulse 99, temperature 98.2 F (36.8 C), temperature source Oral, resp. rate 18, height 5\' 6"  (1.676 m), weight 145 lb (65.8 kg), last menstrual period 09/29/2016.  Physical Exam  Constitutional: She is oriented to person, place, and time. She appears well-developed and well-nourished. No distress.  HENT:  Head: Normocephalic.  Eyes: Pupils are equal, round, and reactive to light.  Respiratory: Effort normal.  Genitourinary:  Genitourinary Comments: Vagina - Small amount of white vaginal discharge, no odor  Cervix - No contact bleeding, no active bleeding  Bimanual exam: Cervix closed Uterus non tender, normal size Adnexa non tender, no masses bilaterally GC/Chlam, wet prep done Chaperone present for exam.   Musculoskeletal: Normal range of motion.  Neurological: She is alert and oriented to person, place, and time.  Skin: Skin is warm. She is not diaphoretic.  Psychiatric: Her behavior is normal.    MAU Course  Procedures  None  MDM  Wet prep GCC CBC Toradol 60 mg IM, patient rates her pain 0/10 following Toradol    Assessment and Plan   A:  1. Abdominal pain in female  P:  Discharge home in stable condition Discussed the importance of GYN provider  Return to MAU for emergencies only   Lezlie Lye, NP 10/22/2016 6:29 PM

## 2016-10-22 NOTE — MAU Note (Signed)
Pt C/O lower abd & lower back pain that started last week, had intermittent bleeding last week, no bleeding now.  Underwear has been very wet, having to wear a pad.  Started vomiting this morning, diarrhea for the past 2 days.  Emesis this morning was black.  Has had 6-7 diarrhea stools in the last 24 hours.

## 2016-10-22 NOTE — Discharge Instructions (Signed)

## 2016-10-23 LAB — GC/CHLAMYDIA PROBE AMP (~~LOC~~) NOT AT ARMC
Chlamydia: POSITIVE — AB
Neisseria Gonorrhea: NEGATIVE

## 2016-10-23 LAB — URINE CULTURE
Culture: NO GROWTH
Special Requests: NORMAL

## 2016-10-25 ENCOUNTER — Telehealth (HOSPITAL_COMMUNITY): Payer: Self-pay | Admitting: *Deleted

## 2016-10-25 NOTE — Telephone Encounter (Signed)
Attempted to call pt. X2 for positive Chlamydia  Results from 10/22/16.  Have faxed form to Health dept. And advised that pt. Has not had treatment.

## 2016-12-11 IMAGING — CT CT RENAL STONE PROTOCOL
2 of 4 series · 5 of 46 positions shown, 7 images · non-contrast
Comparison: None.

CLINICAL DATA: Abdominal pain. Vomiting. Hematuria. History kidney
stones.

EXAM:
CT ABDOMEN AND PELVIS WITHOUT CONTRAST
TECHNIQUE: Multidetector CT imaging of the abdomen and pelvis was performed
following the standard protocol without IV contrast.

[Series 204: cor · coronal · 0.45mm/px · 4 of 97 slices shown, 5 images]
[im 22/97  soft-tissue]
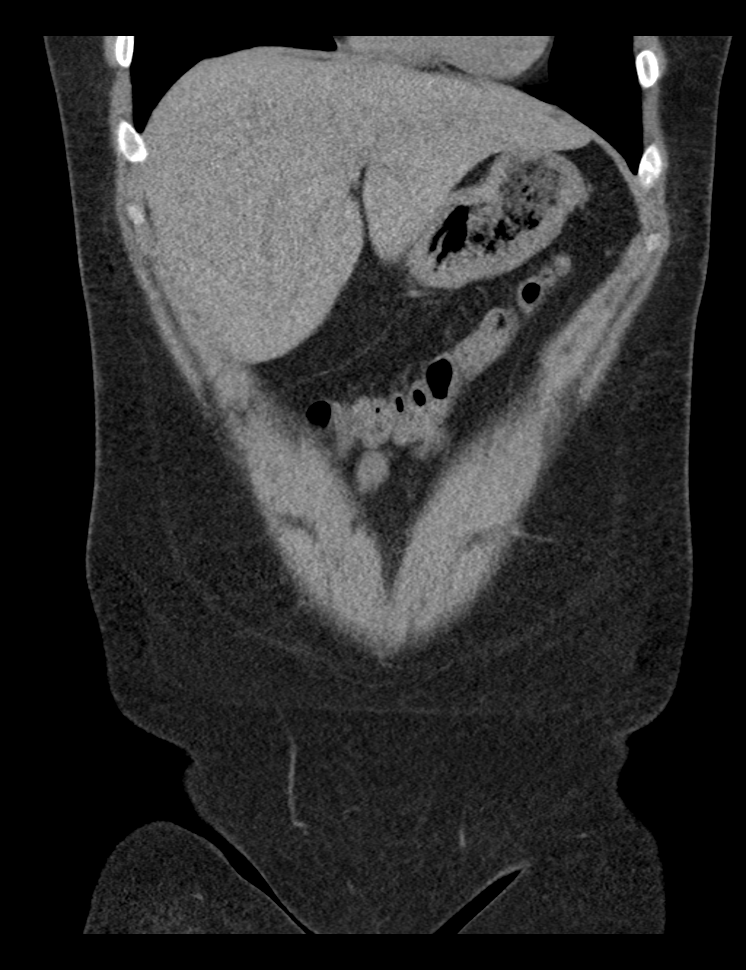
[im 22/97  bone]
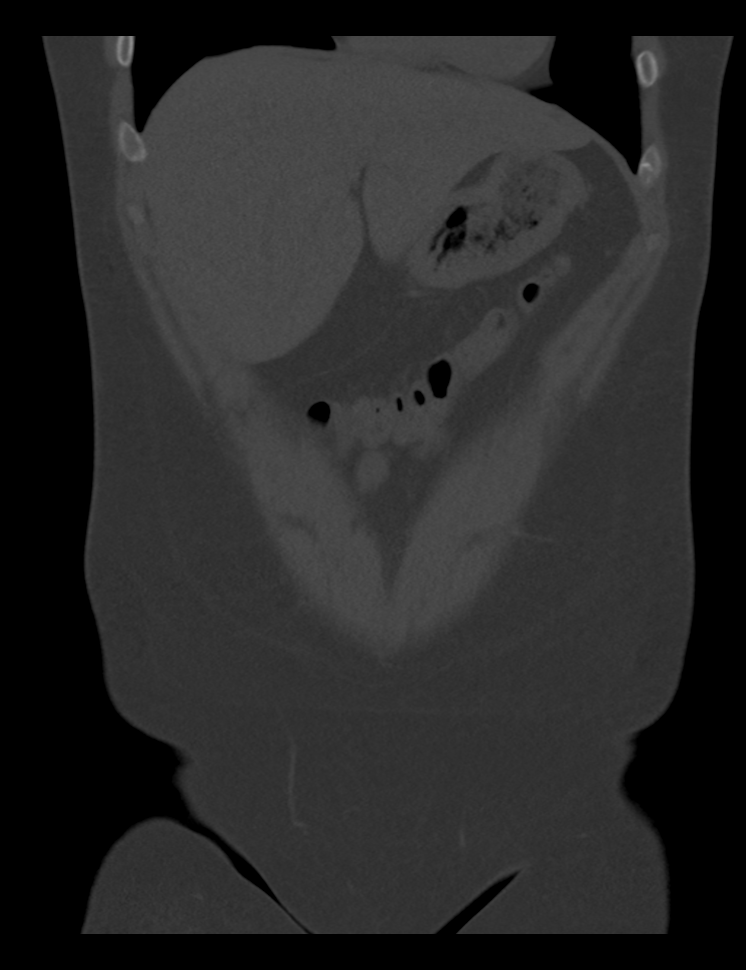
[im 43/97  soft-tissue]
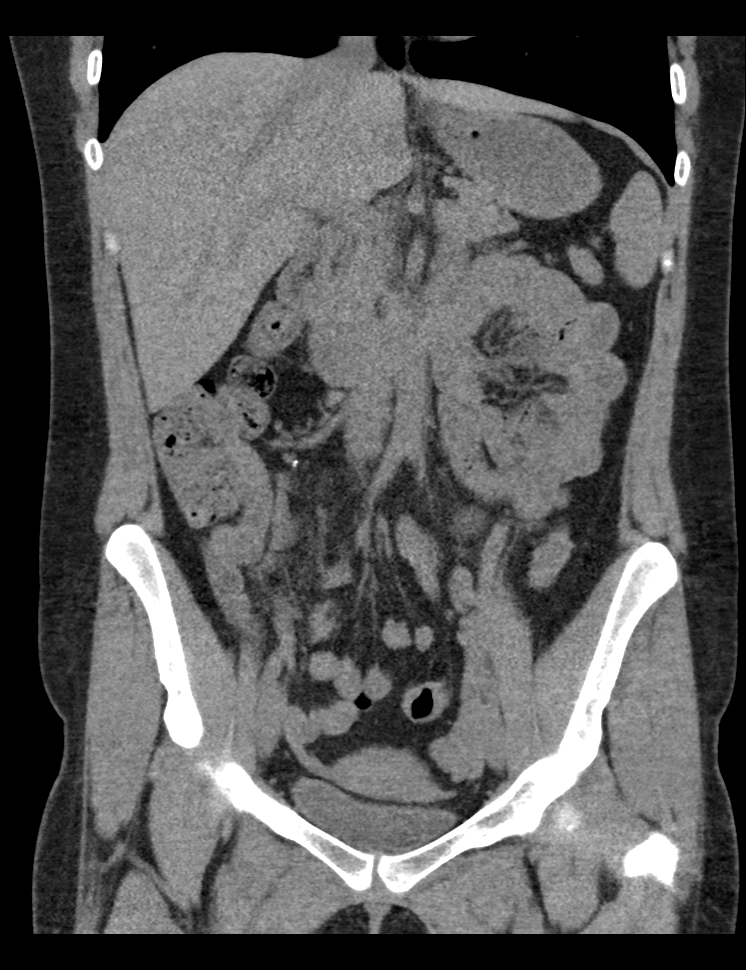
[im 65/97  soft-tissue]
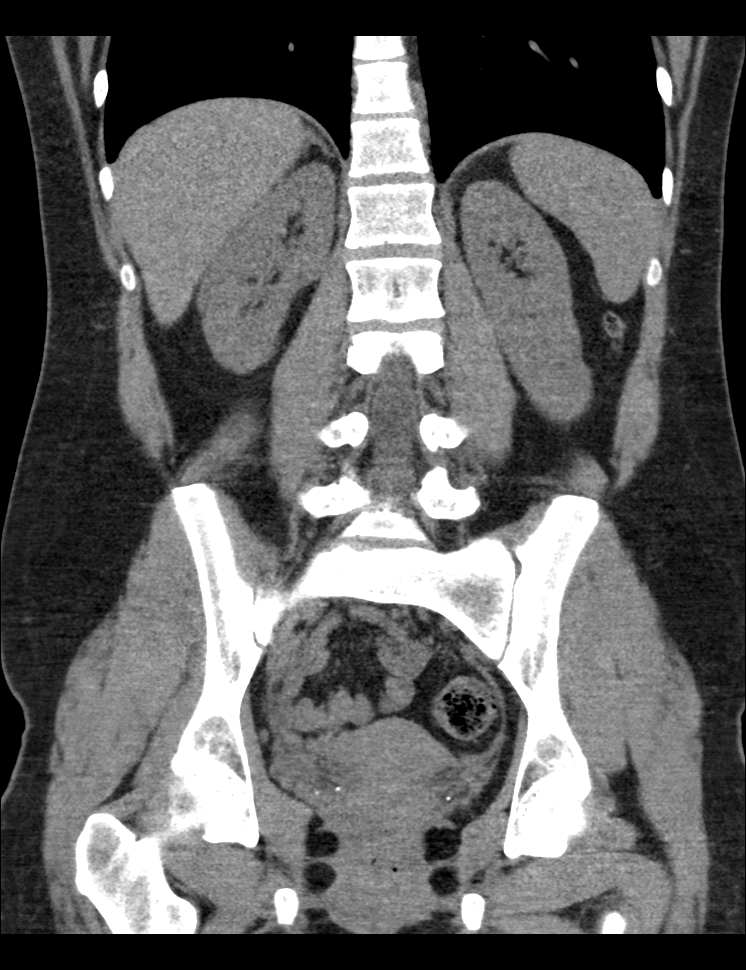
[im 86/97  soft-tissue]
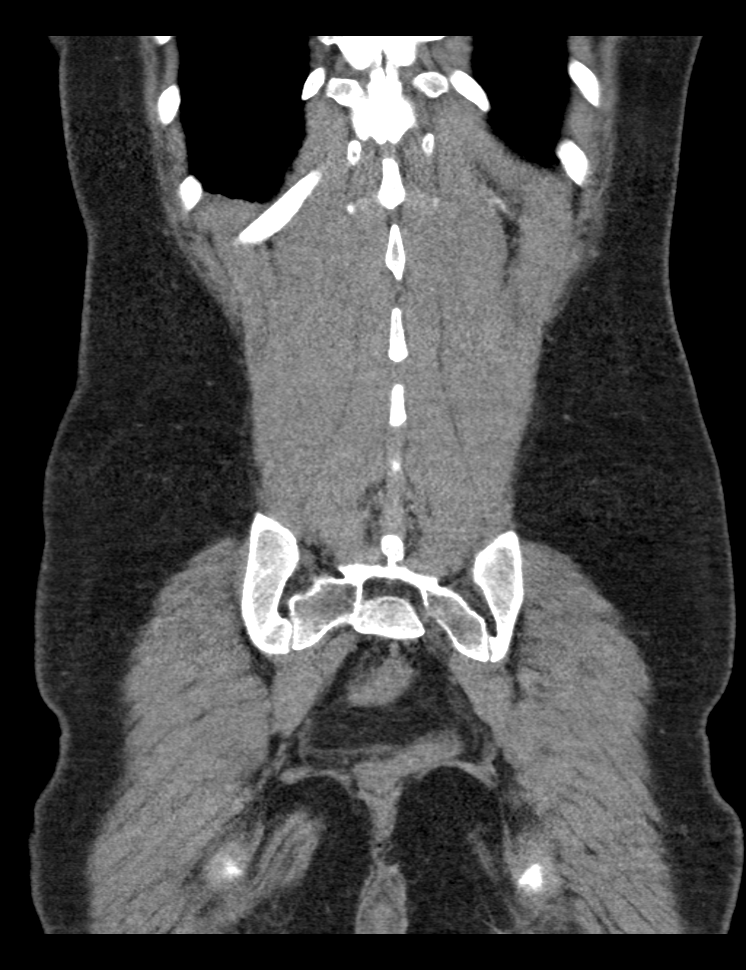

[Series 205: sag · sagittal · 0.45mm/px · 1 of 136 slices shown, 2 images]
[im 46/136  soft-tissue]
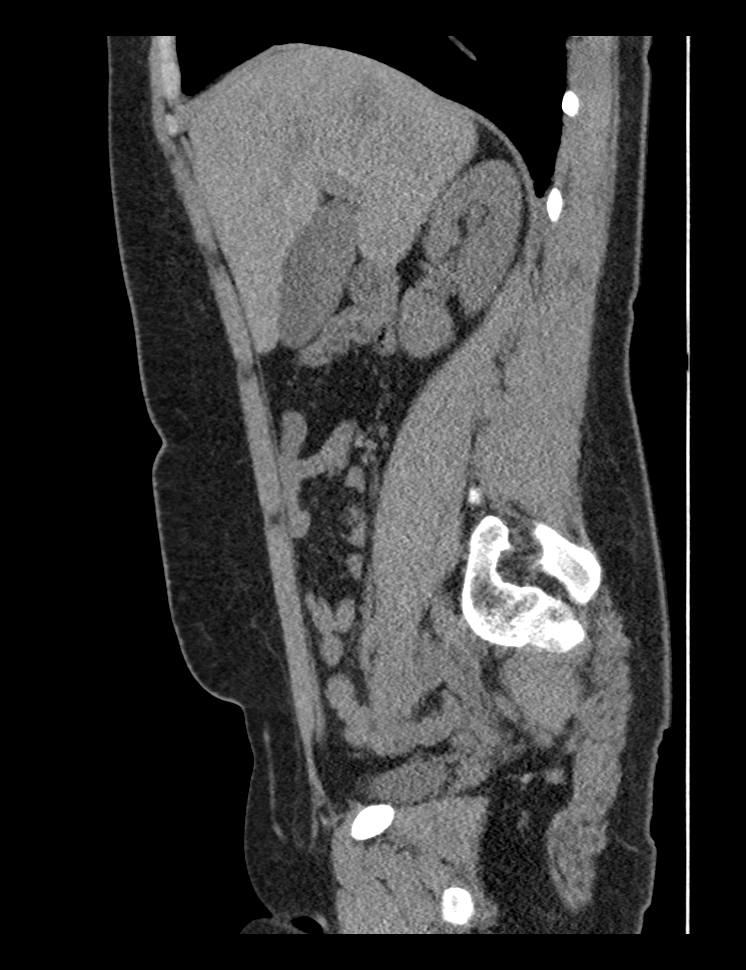
[im 46/136  bone]
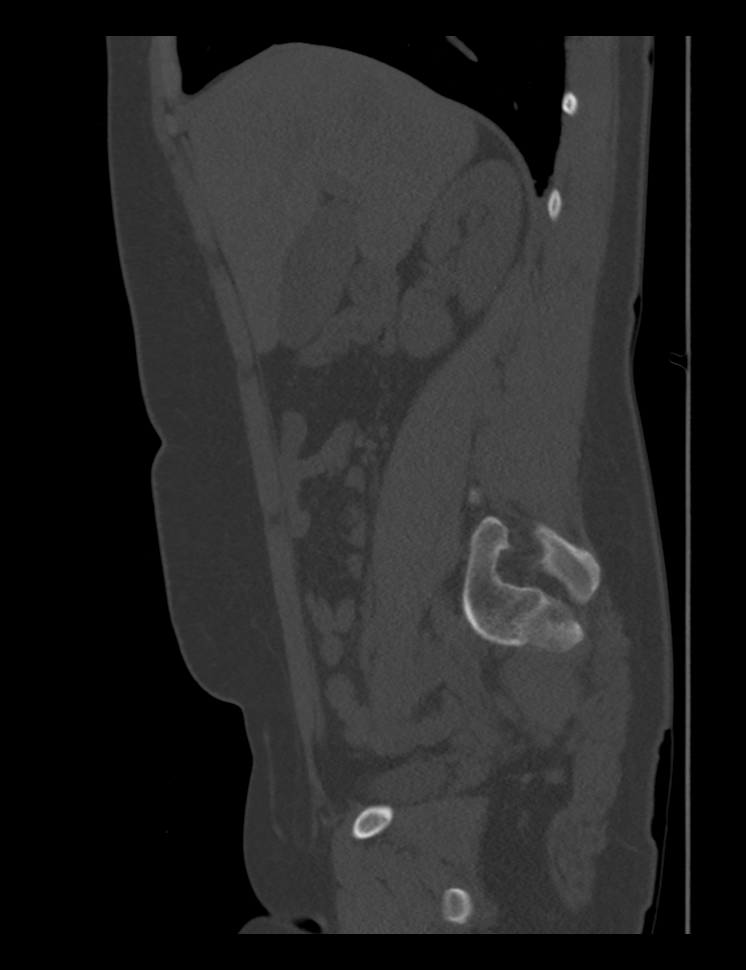

[5 of 46 positions shown; findings below may reference images not displayed]

FINDINGS: Lower chest: Clear lung bases. Normal heart size without pericardial
or pleural effusion.

Hepatobiliary: Normal liver. Normal gallbladder, without biliary
ductal dilatation.

Pancreas: Normal, without mass or ductal dilatation.

Spleen: Normal in size, without focal abnormality.

Adrenals/Urinary Tract: Normal adrenal glands. Malrotated left
kidney. Bilateral renal collecting system calculi. No
hydronephrosis. Calcifications in the pelvis which are all felt to
be vascular. No right and no convincing evidence of left ureteric
stent. No bladder calculi.

Stomach/Bowel: Normal stomach, without wall thickening. Normal
colon, appendix, and terminal ileum. Normal small bowel.

Vascular/Lymphatic: Normal caliber of the aorta and branch vessels.
No pelvic adenopathy.

Reproductive: Normal uterus and adnexa.

Other: No significant free fluid.

Musculoskeletal: No acute osseous abnormality.
IMPRESSION: 1. Bilateral nephrolithiasis. Malrotated left kidney. No obstructive
uropathy and no convincing evidence of ureteric stone. Multiple
vascular calcifications throughout the pelvis.
2. No other explanation for abdominal pain.

## 2017-09-05 ENCOUNTER — Emergency Department (HOSPITAL_BASED_OUTPATIENT_CLINIC_OR_DEPARTMENT_OTHER)
Admission: EM | Admit: 2017-09-05 | Discharge: 2017-09-05 | Disposition: A | Discharge status: Home / Self Care | Payer: Medicaid Other | Attending: Emergency Medicine | Admitting: Emergency Medicine

## 2017-09-05 ENCOUNTER — Encounter (HOSPITAL_BASED_OUTPATIENT_CLINIC_OR_DEPARTMENT_OTHER): Payer: Self-pay | Admitting: Emergency Medicine

## 2017-09-05 DIAGNOSIS — R5383 Other fatigue: Secondary | ICD-10-CM | POA: Diagnosis not present

## 2017-09-05 DIAGNOSIS — R1030 Lower abdominal pain, unspecified: Secondary | ICD-10-CM | POA: Insufficient documentation

## 2017-09-05 DIAGNOSIS — Z3201 Encounter for pregnancy test, result positive: Secondary | ICD-10-CM | POA: Diagnosis not present

## 2017-09-05 DIAGNOSIS — F17210 Nicotine dependence, cigarettes, uncomplicated: Secondary | ICD-10-CM | POA: Insufficient documentation

## 2017-09-05 DIAGNOSIS — N898 Other specified noninflammatory disorders of vagina: Secondary | ICD-10-CM

## 2017-09-05 DIAGNOSIS — Z79899 Other long term (current) drug therapy: Secondary | ICD-10-CM | POA: Diagnosis not present

## 2017-09-05 LAB — URINALYSIS, ROUTINE W REFLEX MICROSCOPIC
BILIRUBIN URINE: NEGATIVE
Glucose, UA: NEGATIVE mg/dL
Hgb urine dipstick: NEGATIVE
Ketones, ur: NEGATIVE mg/dL
LEUKOCYTES UA: NEGATIVE
NITRITE: NEGATIVE
PH: 7 (ref 5.0–8.0)
Protein, ur: NEGATIVE mg/dL
SPECIFIC GRAVITY, URINE: 1.01 (ref 1.005–1.030)

## 2017-09-05 LAB — WET PREP, GENITAL
CLUE CELLS WET PREP: NONE SEEN
SPERM: NONE SEEN
Trich, Wet Prep: NONE SEEN
Yeast Wet Prep HPF POC: NONE SEEN

## 2017-09-05 LAB — CBC WITH DIFFERENTIAL/PLATELET
BASOS ABS: 0 10*3/uL (ref 0.0–0.1)
Basophils Relative: 0 %
Eosinophils Absolute: 0.1 10*3/uL (ref 0.0–0.7)
Eosinophils Relative: 1 %
HEMATOCRIT: 35.9 % — AB (ref 36.0–46.0)
HEMOGLOBIN: 12 g/dL (ref 12.0–15.0)
LYMPHS PCT: 25 %
Lymphs Abs: 1.7 10*3/uL (ref 0.7–4.0)
MCH: 31.2 pg (ref 26.0–34.0)
MCHC: 33.4 g/dL (ref 30.0–36.0)
MCV: 93.2 fL (ref 78.0–100.0)
MONO ABS: 0.5 10*3/uL (ref 0.1–1.0)
Monocytes Relative: 7 %
NEUTROS ABS: 4.6 10*3/uL (ref 1.7–7.7)
Neutrophils Relative %: 67 %
Platelets: 301 10*3/uL (ref 150–400)
RBC: 3.85 MIL/uL — ABNORMAL LOW (ref 3.87–5.11)
RDW: 12.1 % (ref 11.5–15.5)
WBC: 7 10*3/uL (ref 4.0–10.5)

## 2017-09-05 LAB — BASIC METABOLIC PANEL
ANION GAP: 3 — AB (ref 5–15)
BUN: 10 mg/dL (ref 6–20)
CO2: 23 mmol/L (ref 22–32)
Calcium: 8.8 mg/dL — ABNORMAL LOW (ref 8.9–10.3)
Chloride: 111 mmol/L (ref 101–111)
Creatinine, Ser: 0.65 mg/dL (ref 0.44–1.00)
GFR calc Af Amer: >60 mL/min (ref >60)
GLUCOSE: 94 mg/dL (ref 65–99)
POTASSIUM: 3.9 mmol/L (ref 3.5–5.1)
Sodium: 137 mmol/L (ref 135–145)

## 2017-09-05 LAB — PREGNANCY, URINE: Preg Test, Ur: POSITIVE — AB

## 2017-09-05 NOTE — Discharge Instructions (Addendum)
You were seen in the ED for vaginal discharge and fatigue.  Your urine pregnancy test was found to be positive and you will need to establish medical care with an OB.  We have referred you to [facility] for this.   Don't take Motrin during this time for discomfort in addition to avoiding tobacco products and alcohol.    Additionally, we will let you know if your other tests come back positive and will send in appropriate antibiotics if needed.

## 2017-09-05 NOTE — ED Triage Notes (Signed)
Patient states that she has had right sided intermittent back pain x "months" ( a few) the patient also states that over the last 2 - 4 weeks she has had "funny" smelling urine, with vaginal d/c and other noted "problems in that area"

## 2017-09-05 NOTE — ED Provider Notes (Signed)
[facility] EMERGENCY DEPARTMENT Provider Note   CSN: [id number] Arrival date & time: 09/05/17  0737    History   Chief Complaint Chief Complaint  Patient presents with  . Vaginal Discharge   HPI Krystal Garrett is a 26 y.o. female with h/o kidney stones, anxiety, depression, OCD presenting with multiple complaints of pain and feeling tired.  Endorses headaches, lower abdominal cramping, vaginal discharge and burning with urination which all began within the last week.  Also endorsing constipation over the past 3-4 weeks.  Feels as though she has not completely emptied her bladder after urination.  Denies fevers, chills, burning with urination.  Denies increased frequency.  Has been feeling tired recently despite sleeping more.  Has been drinking plenty of water and eating habits have not changed.  With R sided back pain that has been ongoing for 2-3 months.  At times improves but flares intermittently.  Denies recent falls or trauma.  Denies lifting anything heavy that may have hurt her back.  Vaginal discharge is cloudy/clear.  Is not associated with vaginal itching or irritation.  She is currently sexually active.  Is concerned because she had Chlamydia last year which was treated however she did not follow up with a doctor afterwards and does not currently have a doctor however would like referral for one.  Endorses pain with sex.  She reports her menstrual cycle which was previously regular is now consistently abnormal - either occurring late or has a heavier flow than normal. Unsure if she is pregnant. Denies chest pain, shortness of breath, palpitations, leg swelling.    Past Medical History:  Diagnosis Date  . Anxiety   . Depression   . Gallstone   . Kidney stone   . OCD (obsessive compulsive disorder)    Past Surgical History:  Procedure Laterality Date  . TONSILLECTOMY AND ADENOIDECTOMY     as a child   OB History    Gravida Para Term Preterm AB Living   1 1 1     1      SAB TAB Ectopic Multiple Live Births           1     Home Medications    Prior to Admission medications   Medication Sig Start Date End Date Taking? Authorizing Provider  dicyclomine (BENTYL) 20 MG tablet Take 1 tablet (20 mg total) by mouth 2 (two) times daily. 08/13/16   Antonietta Breach, PA-C  Multiple Vitamin (THERA) TABS Take 1 tablet by mouth daily.    [provider]  naproxen (NAPROSYN) 500 MG tablet Take 1 tablet (500 mg total) by mouth 2 (two) times daily. 08/13/16   Antonietta Breach, PA-C  naproxen sodium (ANAPROX) 220 MG tablet Take 440 mg by mouth daily as needed (for pain).    [provider]  promethazine (PHENERGAN) 25 MG tablet Take 1 tablet (25 mg total) by mouth every 6 (six) hours as needed for nausea or vomiting. 08/13/16   Antonietta Breach, PA-C   Family History Family History  Problem Relation Age of Onset  . Hypertension Mother   . Diabetes Mother   . Hypertension Father   . Cancer Maternal Grandmother   . Diabetes Maternal Grandmother   . Hypertension Maternal Grandmother    Social History Social History  Substance Use Topics  . Smoking status: Current Every Day Smoker    Packs/day: 0.25    Types: Cigarettes  . Smokeless tobacco: Never Used  . Alcohol use No   Allergies  Patient has no known allergies.  Review of Systems Review of Systems  Constitutional: Positive for fatigue. Negative for appetite change, chills, diaphoresis and fever.  Respiratory: Negative for cough, shortness of breath and wheezing.   Cardiovascular: Negative for chest pain, palpitations and leg swelling.  Gastrointestinal: Positive for abdominal pain and constipation. Negative for nausea and vomiting.   Physical Exam Updated Vital Signs BP (!) 96/53 (BP Location: Right Arm)   Pulse 84   Temp 98.7 F (37.1 C) (Oral)   Resp 16   Ht 5\' 6"  (1.676 m)   Wt 63.5 kg (140 lb)   LMP 08/07/2017   SpO2 100%   BMI 22.60 kg/m   Physical Exam Gen- 26 yo female, NAD   HEENT - EOMI, PERRL, MMM, o/p clear  Neck - supple, no LAD  Chest  - CTAB, no wheeze, comfortable WOB Heart - RRR no MRG  Abdomen - soft, mildly TTP over bilateral lower quadrants, no rebound or guarding present, +bs  Musculoskeletal - no edema or tenderness  Neuro - alert, oriented x3, no focal deficits   ED Treatments / Results  Labs (all labs ordered are listed, but only abnormal results are displayed) Labs Reviewed  WET PREP, GENITAL - Abnormal; Notable for the following:       Result Value   WBC, Wet Prep HPF POC MANY (*)    All other components within normal limits  BASIC METABOLIC PANEL - Abnormal; Notable for the following:    Calcium 8.8 (*)    Anion gap 3 (*)    All other components within normal limits  CBC WITH DIFFERENTIAL/PLATELET - Abnormal; Notable for the following:    RBC 3.85 (*)    HCT 35.9 (*)    All other components within normal limits  PREGNANCY, URINE - Abnormal; Notable for the following:    Preg Test, Ur POSITIVE (*)    All other components within normal limits  URINALYSIS, ROUTINE W REFLEX MICROSCOPIC  GC/CHLAMYDIA PROBE AMP ([place]) NOT AT [facility] [place]   EKG  EKG Interpretation None      Radiology No results found.  Procedures Procedures - None  Medications Ordered in ED Medications - No data to display  Initial Impression / Assessment and Plan / ED Course  I have reviewed the triage vital signs and the nursing notes.    Pertinent labs & imaging results that were available during my care of the patient were reviewed by me and considered in my medical decision making (see chart for details).   26 yo female presenting with vaginal discharge, lower abdominal pain and multiple chronic complaints.  UPT returned positive for pregnancy; UA without suspicion for infection.  Wet prep negative and GC chlamydia sent.  With no known exposure, GC/chlamydia not treated in ED but will follow up with results.   CBC without concern for anemia or infection  with normal white count and hemoglobin.  BMET within normal range.  Likely abdominal pain and feeling tired is related to pregnancy.   -Referral to [facility] to establish with OB -Discussed pregnancy safety and advised to avoid alcohol and tobacco products  -Return precautions reviewed and pt expressed good understanding   Final Clinical Impressions(s) / ED Diagnoses   Final diagnoses:  Vaginal discharge  Fatigue, unspecified type   New Prescriptions New Prescriptions   No medications on file   Lovenia Kim, MD [place], PGY-2     Lovenia Kim, MD 09/05/17 1037    Long,  Wonda [facility], MD 09/06/17 [phone]

## 2017-09-06 LAB — GC/CHLAMYDIA PROBE AMP (~~LOC~~) NOT AT ARMC
Chlamydia: NEGATIVE
Neisseria Gonorrhea: NEGATIVE

## 2017-09-20 ENCOUNTER — Encounter (HOSPITAL_COMMUNITY): Payer: Self-pay | Admitting: *Deleted

## 2017-09-20 ENCOUNTER — Inpatient Hospital Stay (HOSPITAL_COMMUNITY)
Admission: AD | Admit: 2017-09-20 | Discharge: 2017-09-20 | Disposition: A | Discharge status: Home / Self Care | Payer: Medicaid Other | Source: Ambulatory Visit | Attending: Obstetrics & Gynecology | Admitting: Obstetrics & Gynecology

## 2017-09-20 ENCOUNTER — Inpatient Hospital Stay (HOSPITAL_COMMUNITY): Payer: Medicaid Other

## 2017-09-20 DIAGNOSIS — O219 Vomiting of pregnancy, unspecified: Secondary | ICD-10-CM

## 2017-09-20 DIAGNOSIS — F329 Major depressive disorder, single episode, unspecified: Secondary | ICD-10-CM | POA: Insufficient documentation

## 2017-09-20 DIAGNOSIS — R109 Unspecified abdominal pain: Secondary | ICD-10-CM

## 2017-09-20 DIAGNOSIS — O99341 Other mental disorders complicating pregnancy, first trimester: Secondary | ICD-10-CM | POA: Diagnosis not present

## 2017-09-20 DIAGNOSIS — Z809 Family history of malignant neoplasm, unspecified: Secondary | ICD-10-CM | POA: Diagnosis not present

## 2017-09-20 DIAGNOSIS — N898 Other specified noninflammatory disorders of vagina: Secondary | ICD-10-CM | POA: Diagnosis not present

## 2017-09-20 DIAGNOSIS — Z87891 Personal history of nicotine dependence: Secondary | ICD-10-CM | POA: Diagnosis not present

## 2017-09-20 DIAGNOSIS — Z3A01 Less than 8 weeks gestation of pregnancy: Secondary | ICD-10-CM | POA: Diagnosis not present

## 2017-09-20 DIAGNOSIS — Z79899 Other long term (current) drug therapy: Secondary | ICD-10-CM | POA: Diagnosis not present

## 2017-09-20 DIAGNOSIS — F419 Anxiety disorder, unspecified: Secondary | ICD-10-CM | POA: Diagnosis not present

## 2017-09-20 DIAGNOSIS — Z87442 Personal history of urinary calculi: Secondary | ICD-10-CM | POA: Diagnosis not present

## 2017-09-20 DIAGNOSIS — Z833 Family history of diabetes mellitus: Secondary | ICD-10-CM | POA: Diagnosis not present

## 2017-09-20 DIAGNOSIS — Z8249 Family history of ischemic heart disease and other diseases of the circulatory system: Secondary | ICD-10-CM | POA: Insufficient documentation

## 2017-09-20 DIAGNOSIS — R1031 Right lower quadrant pain: Secondary | ICD-10-CM | POA: Diagnosis not present

## 2017-09-20 DIAGNOSIS — O26899 Other specified pregnancy related conditions, unspecified trimester: Secondary | ICD-10-CM

## 2017-09-20 DIAGNOSIS — Z9889 Other specified postprocedural states: Secondary | ICD-10-CM | POA: Insufficient documentation

## 2017-09-20 DIAGNOSIS — F429 Obsessive-compulsive disorder, unspecified: Secondary | ICD-10-CM | POA: Diagnosis not present

## 2017-09-20 DIAGNOSIS — O26891 Other specified pregnancy related conditions, first trimester: Secondary | ICD-10-CM

## 2017-09-20 LAB — WET PREP, GENITAL
CLUE CELLS WET PREP: NONE SEEN
SPERM: NONE SEEN
TRICH WET PREP: NONE SEEN
Yeast Wet Prep HPF POC: NONE SEEN

## 2017-09-20 LAB — CBC
HCT: 38.3 % (ref 36.0–46.0)
HEMOGLOBIN: 12.7 g/dL (ref 12.0–15.0)
MCH: 31.1 pg (ref 26.0–34.0)
MCHC: 33.2 g/dL (ref 30.0–36.0)
MCV: 93.6 fL (ref 78.0–100.0)
PLATELETS: 353 10*3/uL (ref 150–400)
RBC: 4.09 MIL/uL (ref 3.87–5.11)
RDW: 12.2 % (ref 11.5–15.5)
WBC: 13.1 10*3/uL — ABNORMAL HIGH (ref 4.0–10.5)

## 2017-09-20 LAB — URINALYSIS, ROUTINE W REFLEX MICROSCOPIC
Bilirubin Urine: NEGATIVE
Glucose, UA: NEGATIVE mg/dL
Hgb urine dipstick: NEGATIVE
Ketones, ur: NEGATIVE mg/dL
LEUKOCYTES UA: NEGATIVE
NITRITE: NEGATIVE
PH: 6 (ref 5.0–8.0)
Protein, ur: NEGATIVE mg/dL
SPECIFIC GRAVITY, URINE: 1.012 (ref 1.005–1.030)

## 2017-09-20 LAB — HCG, QUANTITATIVE, PREGNANCY: hCG, Beta Chain, Quant, S: 119060 m[IU]/mL — ABNORMAL HIGH (ref <5)

## 2017-09-20 LAB — ABO/RH: ABO/RH(D): AB NEG

## 2017-09-20 MED ORDER — PROMETHAZINE HCL 25 MG PO TABS: 25.0000 mg | ORAL_TABLET | Freq: Four times a day (QID) | ORAL | 2 refills | Status: DC | PRN

## 2017-09-20 MED ORDER — PROMETHAZINE HCL 25 MG/ML IJ SOLN
25.0000 mg | Freq: Once | INTRAMUSCULAR | Status: AC
  Administered 2017-09-20: 25 mg via INTRAMUSCULAR
  Filled 2017-09-20: qty 1

## 2017-09-20 MED ORDER — RANITIDINE HCL 150 MG PO TABS: 150.0000 mg | ORAL_TABLET | Freq: Two times a day (BID) | ORAL | 2 refills | Status: DC

## 2017-09-20 NOTE — MAU Provider Note (Signed)
History     CSN: [id number]  Arrival date and time: 09/20/17 1742   First Provider Initiated Contact with Patient 09/20/17 1815     Chief Complaint  Patient presents with  . Emesis During Pregnancy  . Vaginal Discharge   HPI Krystal Garrett is a 26 y.o. G2P1001 at [redacted]w[redacted]d who presents with vaginal discharge and abdominal pain. She states for the last 2 days, she has had intermittent right sided lower abdominal pain that she rates a 7/10 and has not tried anything for the pain. She also has an increase in clear discharge without an odor for the last 2 days. She denies any vaginal bleeding. She reports nausea and vomiting, stating she has vomited 8 times in the last 24 hours. She plans to get her prenatal care at [facility].   OB History    Gravida Para Term Preterm AB Living   2 1 1  0 0 1   SAB TAB Ectopic Multiple Live Births   0 0 0 0 1      Past Medical History:  Diagnosis Date  . Anxiety   . Depression   . Gallstone   . Kidney stone   . OCD (obsessive compulsive disorder)     Past Surgical History:  Procedure Laterality Date  . TONSILLECTOMY AND ADENOIDECTOMY     as a child    Family History  Problem Relation Age of Onset  . Hypertension Mother   . Diabetes Mother   . Hypertension Father   . Cancer Maternal Grandmother   . Diabetes Maternal Grandmother   . Hypertension Maternal Grandmother     Social History   Tobacco Use  . Smoking status: Former Smoker    Packs/day: 0.25    Types: Cigarettes  . Smokeless tobacco: Never Used  Substance Use Topics  . Alcohol use: No  . Drug use: No    Allergies: No Known Allergies  Medications Prior to Admission  Medication Sig Dispense Refill Last Dose  . calcium carbonate (TUMS - DOSED IN MG ELEMENTAL CALCIUM) 500 MG chewable tablet Chew 1 tablet 3 (three) times daily as needed by mouth for indigestion or heartburn.   09/19/2017 at Unknown time  . Prenatal Vit-Fe Fumarate-FA (PRENATAL  MULTIVITAMIN) TABS tablet Take 2 tablets daily at 12 noon by mouth.   09/19/2017 at Unknown time    Review of Systems  Constitutional: Negative.  Negative for fatigue and fever.  HENT: Negative.   Respiratory: Negative.  Negative for shortness of breath.   Cardiovascular: Negative.  Negative for chest pain.  Gastrointestinal: Positive for abdominal pain, nausea and vomiting. Negative for constipation and diarrhea.  Genitourinary: Positive for vaginal discharge. Negative for dysuria.  Neurological: Negative.  Negative for dizziness and headaches.   Physical Exam   Blood pressure 119/71, pulse 94, temperature 98.7 F (37.1 C), temperature source Oral, resp. rate 16, weight 161 lb 4 oz (73.1 kg), last menstrual period 08/02/2017, SpO2 100 %.  Physical Exam  Nursing note and vitals reviewed. Constitutional: She is oriented to person, place, and time. She appears well-developed and well-nourished. No distress.  HENT:  Head: Normocephalic.  Eyes: Pupils are equal, round, and reactive to light.  Cardiovascular: Normal rate, regular rhythm and normal heart sounds.  Respiratory: Effort normal and breath sounds normal. No respiratory distress.  GI: Soft. Bowel sounds are normal. She exhibits no distension. There is no tenderness. There is no rebound and no guarding.  Neurological: She is alert and oriented to person,  place, and time.  Skin: Skin is warm and dry.  Psychiatric: She has a normal mood and affect. Her behavior is normal. Judgment and thought content normal.   Pelvic exam: Cervix pink, visually closed, without lesion, scant white creamy discharge, vaginal walls and external genitalia normal Bimanual exam: Cervix 0/long/high, firm, anterior, neg CMT, uterus nontender, adnexa without tenderness, enlargement, or mass  MAU Course  Procedures Results for orders placed or performed during the hospital encounter of 09/20/17 (from the past 24 hour(s))  Urinalysis, Routine w reflex  microscopic     Status: Abnormal   Collection Time: 09/20/17  5:50 PM  Result Value Ref Range   Color, Urine YELLOW YELLOW   APPearance HAZY (A) CLEAR   Specific Gravity, Urine 1.012 1.005 - 1.030   pH 6.0 5.0 - 8.0   Glucose, UA NEGATIVE NEGATIVE mg/dL   Hgb urine dipstick NEGATIVE NEGATIVE   Bilirubin Urine NEGATIVE NEGATIVE   Ketones, ur NEGATIVE NEGATIVE mg/dL   Protein, ur NEGATIVE NEGATIVE mg/dL   Nitrite NEGATIVE NEGATIVE   Leukocytes, UA NEGATIVE NEGATIVE  CBC     Status: Abnormal   Collection Time: 09/20/17  6:20 PM  Result Value Ref Range   WBC 13.1 (H) 4.0 - 10.5 K/uL   RBC 4.09 3.87 - 5.11 MIL/uL   Hemoglobin 12.7 12.0 - 15.0 g/dL   HCT 38.3 36.0 - 46.0 %   MCV 93.6 78.0 - 100.0 fL   MCH 31.1 26.0 - 34.0 pg   MCHC 33.2 30.0 - 36.0 g/dL   RDW 12.2 11.5 - 15.5 %   Platelets 353 150 - 400 K/uL  hCG, quantitative, pregnancy     Status: Abnormal   Collection Time: 09/20/17  6:20 PM  Result Value Ref Range   hCG, Beta Chain, Quant, S 119,060 (H) <5 mIU/mL  ABO/Rh     Status: None (Preliminary result)   Collection Time: 09/20/17  6:20 PM  Result Value Ref Range   ABO/RH(D) AB NEG   Wet prep, genital     Status: Abnormal   Collection Time: 09/20/17  7:00 PM  Result Value Ref Range   Yeast Wet Prep HPF POC NONE SEEN NONE SEEN   Trich, Wet Prep NONE SEEN NONE SEEN   Clue Cells Wet Prep HPF POC NONE SEEN NONE SEEN   WBC, Wet Prep HPF POC MODERATE (A) NONE SEEN   Sperm NONE SEEN    US Ob Comp Less 14 Wks  Result Date: 09/20/2017 CLINICAL DATA:  26 year old pregnant female presenting with abdominal cramping. LMP: 08/02/2017 corresponding to an estimated gestational age of [redacted] weeks, 0 days. EXAM: OBSTETRIC <14 WK [place] AND TRANSVAGINAL OB US TECHNIQUE: Both transabdominal and transvaginal ultrasound examinations were performed for complete evaluation of the gestation as well as the maternal uterus, adnexal regions, and pelvic cul-de-sac. Transvaginal technique was performed to  assess early pregnancy. COMPARISON:  None. FINDINGS: Intrauterine gestational sac: Single intrauterine gestational sac. Yolk sac:  Seen Embryo:  Present Cardiac Activity: Detected Heart Rate: 144  bpm CRL:  10  mm   7 w   0 d                  [place] EDC: 05/09/2018 Subchorionic hemorrhage:  None visualized. Maternal uterus/adnexae: The right ovary is not visualized. The left ovary is unremarkable. IMPRESSION: Single live intrauterine pregnancy with an estimated gestational age of [redacted] weeks, 0 days concordant with LMP. Electronically Signed   By: Anner Crete M.D.   On:  09/20/2017 19:56   US Ob Transvaginal  Result Date: 09/20/2017 CLINICAL DATA:  26 year old pregnant female presenting with abdominal cramping. LMP: 08/02/2017 corresponding to an estimated gestational age of [redacted] weeks, 0 days. EXAM: OBSTETRIC <14 WK [place] AND TRANSVAGINAL OB US TECHNIQUE: Both transabdominal and transvaginal ultrasound examinations were performed for complete evaluation of the gestation as well as the maternal uterus, adnexal regions, and pelvic cul-de-sac. Transvaginal technique was performed to assess early pregnancy. COMPARISON:  None. FINDINGS: Intrauterine gestational sac: Single intrauterine gestational sac. Yolk sac:  Seen Embryo:  Present Cardiac Activity: Detected Heart Rate: 144  bpm CRL:  10  mm   7 w   0 d                  [place] EDC: 05/09/2018 Subchorionic hemorrhage:  None visualized. Maternal uterus/adnexae: The right ovary is not visualized. The left ovary is unremarkable. IMPRESSION: Single live intrauterine pregnancy with an estimated gestational age of [redacted] weeks, 0 days concordant with LMP. Electronically Signed   By: Anner Crete M.D.   On: 09/20/2017 19:56    MDM UA, UPT CBC, quant, abo/rh, HIV Wet prep and gc/chlamydia Phenergan 25mg  IM US OB Transvaginal  US OB Comp Less 14 weeks- IUP measuring 7 weeks with a heart rate of 144bpm Low suspicion of appendicitis- no acute abdomen  Assessment and Plan    1. Abdominal pain affecting pregnancy   2. Vaginal discharge during pregnancy in first trimester   3. [redacted] weeks gestation of pregnancy   4. Nausea and vomiting during pregnancy    -Discharge home in stable condition -Rx for promethazine and ranitidine sent to patient's pharmacy -Strict appendicitis precautions discussed -Patient advised to follow-up with [facility] as scheduled for prenatal care.  Patient may return to MAU as needed or if her condition were to change or worsen   Wende Mott CNM 09/20/2017, 7:48 PM   Allergies as of 09/20/2017   No Known Allergies     Medication List    TAKE these medications   calcium carbonate 500 MG chewable tablet Commonly known as:  TUMS - dosed in mg elemental calcium Chew 1 tablet 3 (three) times daily as needed by mouth for indigestion or heartburn.   prenatal multivitamin Tabs tablet Take 2 tablets daily at 12 noon by mouth.   promethazine 25 MG tablet Commonly known as:  PHENERGAN Take 1 tablet (25 mg total) every 6 (six) hours as needed by mouth for nausea or vomiting.   ranitidine 150 MG tablet Commonly known as:  ZANTAC Take 1 tablet (150 mg total) 2 (two) times daily by mouth.

## 2017-09-20 NOTE — MAU Note (Signed)
Pt having "thick clear vaginal discharge" and "unable to keep anything down." Patient having some sharp intermittent cramping on right lower quadrant.

## 2017-09-20 NOTE — Discharge Instructions (Signed)
Safe Medications in Pregnancy   Acne: Benzoyl Peroxide Salicylic Acid  Backache/Headache: Tylenol: 2 regular strength every 4 hours OR              2 Extra strength every 6 hours  Colds/Coughs/Allergies: Benadryl (alcohol free) 25 mg every 6 hours as needed Breath right strips Claritin Cepacol throat lozenges Chloraseptic throat spray Cold-Eeze- up to three times per day Cough drops, alcohol free Flonase (by prescription only) Guaifenesin Mucinex Robitussin DM (plain only, alcohol free) Saline nasal spray/drops Sudafed (pseudoephedrine) & Actifed ** use only after [redacted] weeks gestation and if you do not have high blood pressure Tylenol Vicks Vaporub Zinc lozenges Zyrtec   Constipation: Colace Ducolax suppositories Fleet enema Glycerin suppositories Metamucil Milk of magnesia Miralax Senokot Smooth move tea  Diarrhea: Kaopectate Imodium A-D  *NO pepto Bismol  Hemorrhoids: Anusol Anusol HC Preparation H Tucks  Indigestion: Tums Maalox Mylanta Zantac  Pepcid  Insomnia: Benadryl (alcohol free) 25mg  every 6 hours as needed Tylenol PM Unisom, no Gelcaps  Leg Cramps: Tums MagGel  Nausea/Vomiting:  Bonine Dramamine Emetrol Ginger extract Sea bands Meclizine  Nausea medication to take during pregnancy:  Unisom (doxylamine succinate 25 mg tablets) Take one tablet daily at bedtime. If symptoms are not adequately controlled, the dose can be increased to a maximum recommended dose of two tablets daily (1/2 tablet in the morning, 1/2 tablet mid-afternoon and one at bedtime). Vitamin B6 100mg  tablets. Take one tablet twice a day (up to 200 mg per day).  Skin Rashes: Aveeno products Benadryl cream or 25mg  every 6 hours as needed Calamine Lotion 1% cortisone cream  Yeast infection: Gyne-lotrimin 7 Monistat 7   **If taking multiple medications, please check labels to avoid duplicating the same active ingredients **take medication as directed on  the label ** Do not exceed 4000 mg of tylenol in 24 hours **Do not take medications that contain aspirin or ibuprofen     Morning Sickness Morning sickness is when you feel sick to your stomach (nauseous) during pregnancy. This nauseous feeling may or may not come with vomiting. It often occurs in the morning but can be a problem any time of day. Morning sickness is most common during the first trimester, but it may continue throughout pregnancy. While morning sickness is unpleasant, it is usually harmless unless you develop severe and continual vomiting (hyperemesis gravidarum). This condition requires more intense treatment. What are the causes? The cause of morning sickness is not completely known but seems to be related to normal hormonal changes that occur in pregnancy. What increases the risk? You are at greater risk if you:  Experienced nausea or vomiting before your pregnancy.  Had morning sickness during a previous pregnancy.  Are pregnant with more than one baby, such as twins.  How is this treated? Do not use any medicines (prescription, over-the-counter, or herbal) for morning sickness without first talking to your health care provider. Your health care provider may prescribe or recommend:  Vitamin B6 supplements.  Anti-nausea medicines.  The herbal medicine ginger.  Follow these instructions at home:  Only take over-the-counter or prescription medicines as directed by your health care provider.  Taking multivitamins before getting pregnant can prevent or decrease the severity of morning sickness in most women.  Eat a piece of dry toast or unsalted crackers before getting out of bed in the morning.  Eat five or six small meals a day.  Eat dry and bland foods (rice, baked potato). Foods high in carbohydrates are often  helpful.  Do not drink liquids with your meals. Drink liquids between meals.  Avoid greasy, fatty, and spicy foods.  Get someone to cook for you  if the smell of any food causes nausea and vomiting.  If you feel nauseous after taking prenatal vitamins, take the vitamins at night or with a snack.  Snack on protein foods (nuts, yogurt, cheese) between meals if you are hungry.  Eat unsweetened gelatins for desserts.  Wearing an acupressure wristband (worn for sea sickness) may be helpful.  Acupuncture may be helpful.  Do not smoke.  Get a humidifier to keep the air in your house free of odors.  Get plenty of fresh air. Contact a health care provider if:  Your home remedies are not working, and you need medicine.  You feel dizzy or lightheaded.  You are losing weight. Get help right away if:  You have persistent and uncontrolled nausea and vomiting.  You pass out (faint). This information is not intended to replace advice given to you by your health care provider. Make sure you discuss any questions you have with your health care provider. Document Released: 12/13/2006 Document Revised: 03/29/2016 Document Reviewed: 04/08/2013 Elsevier Interactive Patient Education  2017 [place] of Pregnancy The first trimester of pregnancy is from week 1 until the end of week 13 (months 1 through 3). A week after a sperm fertilizes an egg, the egg will implant on the wall of the uterus. This embryo will begin to develop into a baby. Genes from you and your partner will form the baby. The female genes will determine whether the baby will be a boy or a girl. At 6-8 weeks, the eyes and face will be formed, and the heartbeat can be seen on ultrasound. At the end of 12 weeks, all the baby's organs will be formed. Now that you are pregnant, you will want to do everything you can to have a healthy baby. Two of the most important things are to get good prenatal care and to follow your health care provider's instructions. Prenatal care is all the medical care you receive before the baby's birth. This care will help prevent, find,  and treat any problems during the pregnancy and childbirth. Body changes during your first trimester Your body goes through many changes during pregnancy. The changes vary from woman to woman.  You may gain or lose a couple of pounds at first.  You may feel sick to your stomach (nauseous) and you may throw up (vomit). If the vomiting is uncontrollable, call your health care provider.  You may tire easily.  You may develop headaches that can be relieved by medicines. All medicines should be approved by your health care provider.  You may urinate more often. Painful urination may mean you have a bladder infection.  You may develop heartburn as a result of your pregnancy.  You may develop constipation because certain hormones are causing the muscles that push stool through your intestines to slow down.  You may develop hemorrhoids or swollen veins (varicose veins).  Your breasts may begin to grow larger and become tender. Your nipples may stick out more, and the tissue that surrounds them (areola) may become darker.  Your gums may bleed and may be sensitive to brushing and flossing.  Dark spots or blotches (chloasma, mask of pregnancy) may develop on your face. This will likely fade after the baby is born.  Your menstrual periods will stop.  You may have a loss  of appetite.  You may develop cravings for certain kinds of food.  You may have changes in your emotions from day to day, such as being excited to be pregnant or being concerned that something may go wrong with the pregnancy and baby.  You may have more vivid and strange dreams.  You may have changes in your hair. These can include thickening of your hair, rapid growth, and changes in texture. Some women also have hair loss during or after pregnancy, or hair that feels dry or thin. Your hair will most likely return to normal after your baby is born.  What to expect at prenatal visits During a routine prenatal visit:  You  will be weighed to make sure you and the baby are growing normally.  Your blood pressure will be taken.  Your abdomen will be measured to track your baby's growth.  The fetal heartbeat will be listened to between weeks 10 and 14 of your pregnancy.  Test results from any previous visits will be discussed.  Your health care provider may ask you:  How you are feeling.  If you are feeling the baby move.  If you have had any abnormal symptoms, such as leaking fluid, bleeding, severe headaches, or abdominal cramping.  If you are using any tobacco products, including cigarettes, chewing tobacco, and electronic cigarettes.  If you have any questions.  Other tests that may be performed during your first trimester include:  Blood tests to find your blood type and to check for the presence of any previous infections. The tests will also be used to check for low iron levels (anemia) and protein on red blood cells (Rh antibodies). Depending on your risk factors, or if you previously had diabetes during pregnancy, you may have tests to check for high blood sugar that affects pregnant women (gestational diabetes).  Urine tests to check for infections, diabetes, or protein in the urine.  An ultrasound to confirm the proper growth and development of the baby.  Fetal screens for spinal cord problems (spina bifida) and Down syndrome.  HIV (human immunodeficiency virus) testing. Routine prenatal testing includes screening for HIV, unless you choose not to have this test.  You may need other tests to make sure you and the baby are doing well.  Follow these instructions at home: Medicines  Follow your health care provider's instructions regarding medicine use. Specific medicines may be either safe or unsafe to take during pregnancy.  Take a prenatal vitamin that contains at least 600 micrograms (mcg) of folic acid.  If you develop constipation, try taking a stool softener if your health care  provider approves. Eating and drinking  Eat a balanced diet that includes fresh fruits and vegetables, whole grains, good sources of protein such as meat, eggs, or tofu, and low-fat dairy. Your health care provider will help you determine the amount of weight gain that is right for you.  Avoid raw meat and uncooked cheese. These carry germs that can cause birth defects in the baby.  Eating four or five small meals rather than three large meals a day may help relieve nausea and vomiting. If you start to feel nauseous, eating a few soda crackers can be helpful. Drinking liquids between meals, instead of during meals, also seems to help ease nausea and vomiting.  Limit foods that are high in fat and processed sugars, such as fried and sweet foods.  To prevent constipation: ? Eat foods that are high in fiber, such as fresh fruits  and vegetables, whole grains, and beans. ? Drink enough fluid to keep your urine clear or pale yellow. Activity  Exercise only as directed by your health care provider. Most women can continue their usual exercise routine during pregnancy. Try to exercise for 30 minutes at least 5 days a week. Exercising will help you: ? Control your weight. ? Stay in shape. ? Be prepared for labor and delivery.  Experiencing pain or cramping in the lower abdomen or lower back is a good sign that you should stop exercising. Check with your health care provider before continuing with normal exercises.  Try to avoid standing for long periods of time. Move your legs often if you must stand in one place for a long time.  Avoid heavy lifting.  Wear low-heeled shoes and practice good posture.  You may continue to have sex unless your health care provider tells you not to. Relieving pain and discomfort  Wear a good support bra to relieve breast tenderness.  Take warm sitz baths to soothe any pain or discomfort caused by hemorrhoids. Use hemorrhoid cream if your health care provider  approves.  Rest with your legs elevated if you have leg cramps or low back pain.  If you develop varicose veins in your legs, wear support hose. Elevate your feet for 15 minutes, 3-4 times a day. Limit salt in your diet. Prenatal care  Schedule your prenatal visits by the twelfth week of pregnancy. They are usually scheduled monthly at first, then more often in the last 2 months before delivery.  Write down your questions. Take them to your prenatal visits.  Keep all your prenatal visits as told by your health care provider. This is important. Safety  Wear your seat belt at all times when driving.  Make a list of emergency phone numbers, including numbers for family, friends, the hospital, and police and fire departments. General instructions  Ask your health care provider for a referral to a local prenatal education class. Begin classes no later than the beginning of month 6 of your pregnancy.  Ask for help if you have counseling or nutritional needs during pregnancy. Your health care provider can offer advice or refer you to specialists for help with various needs.  Do not use hot tubs, steam rooms, or saunas.  Do not douche or use tampons or scented sanitary pads.  Do not cross your legs for long periods of time.  Avoid cat litter boxes and soil used by cats. These carry germs that can cause birth defects in the baby and possibly loss of the fetus by miscarriage or stillbirth.  Avoid all smoking, herbs, alcohol, and medicines not prescribed by your health care provider. Chemicals in these products affect the formation and growth of the baby.  Do not use any products that contain nicotine or tobacco, such as cigarettes and e-cigarettes. If you need help quitting, ask your health care provider. You may receive counseling support and other resources to help you quit.  Schedule a dentist appointment. At home, brush your teeth with a soft toothbrush and be gentle when you  floss. Contact a health care provider if:  You have dizziness.  You have mild pelvic cramps, pelvic pressure, or nagging pain in the abdominal area.  You have persistent nausea, vomiting, or diarrhea.  You have a bad smelling vaginal discharge.  You have pain when you urinate.  You notice increased swelling in your face, hands, legs, or ankles.  You are exposed to fifth disease or  chickenpox.  You are exposed to [place] measles (rubella) and have never had it. Get help right away if:  You have a fever.  You are leaking fluid from your vagina.  You have spotting or bleeding from your vagina.  You have severe abdominal cramping or pain.  You have rapid weight gain or loss.  You vomit blood or material that looks like coffee grounds.  You develop a severe headache.  You have shortness of breath.  You have any kind of trauma, such as from a fall or a car accident. Summary  The first trimester of pregnancy is from week 1 until the end of week 13 (months 1 through 3).  Your body goes through many changes during pregnancy. The changes vary from woman to woman.  You will have routine prenatal visits. During those visits, your health care provider will examine you, discuss any test results you may have, and talk with you about how you are feeling. This information is not intended to replace advice given to you by your health care provider. Make sure you discuss any questions you have with your health care provider. Document Released: 10/16/2001 Document Revised: 10/03/2016 Document Reviewed: 10/03/2016 Elsevier Interactive Patient Education  2017 [employer].

## 2017-09-21 LAB — RPR: RPR Ser Ql: NONREACTIVE

## 2017-09-21 LAB — HIV ANTIBODY (ROUTINE TESTING W REFLEX): HIV SCREEN 4TH GENERATION: NONREACTIVE

## 2017-09-23 LAB — GC/CHLAMYDIA PROBE AMP (~~LOC~~) NOT AT ARMC
Chlamydia: NEGATIVE
Neisseria Gonorrhea: NEGATIVE

## 2017-10-22 ENCOUNTER — Ambulatory Visit (INDEPENDENT_AMBULATORY_CARE_PROVIDER_SITE_OTHER): Payer: Medicaid Other | Admitting: Clinical

## 2017-10-22 ENCOUNTER — Ambulatory Visit (INDEPENDENT_AMBULATORY_CARE_PROVIDER_SITE_OTHER): Payer: Medicaid Other | Admitting: Advanced Practice Midwife

## 2017-10-22 ENCOUNTER — Other Ambulatory Visit (HOSPITAL_COMMUNITY)
Admission: RE | Admit: 2017-10-22 | Discharge: 2017-10-22 | Disposition: A | Discharge status: Home / Self Care | Payer: Medicaid Other | Source: Ambulatory Visit | Attending: Advanced Practice Midwife | Admitting: Advanced Practice Midwife

## 2017-10-22 ENCOUNTER — Encounter: Payer: Self-pay | Admitting: Advanced Practice Midwife

## 2017-10-22 VITALS — BP 112/61 | HR 88 | Wt 169.0 lb

## 2017-10-22 DIAGNOSIS — Z3480 Encounter for supervision of other normal pregnancy, unspecified trimester: Secondary | ICD-10-CM | POA: Insufficient documentation

## 2017-10-22 DIAGNOSIS — F4323 Adjustment disorder with mixed anxiety and depressed mood: Secondary | ICD-10-CM | POA: Diagnosis not present

## 2017-10-22 DIAGNOSIS — Z3A00 Weeks of gestation of pregnancy not specified: Secondary | ICD-10-CM | POA: Diagnosis not present

## 2017-10-22 DIAGNOSIS — Z3481 Encounter for supervision of other normal pregnancy, first trimester: Secondary | ICD-10-CM | POA: Diagnosis not present

## 2017-10-22 DIAGNOSIS — O219 Vomiting of pregnancy, unspecified: Secondary | ICD-10-CM

## 2017-10-22 LAB — POCT URINALYSIS DIP (DEVICE)
Bilirubin Urine: NEGATIVE
GLUCOSE, UA: NEGATIVE mg/dL
Ketones, ur: NEGATIVE mg/dL
LEUKOCYTES UA: NEGATIVE
Nitrite: NEGATIVE
PROTEIN: NEGATIVE mg/dL
Specific Gravity, Urine: 1.02 (ref 1.005–1.030)
UROBILINOGEN UA: 0.2 mg/dL (ref 0.0–1.0)
pH: 7 (ref 5.0–8.0)

## 2017-10-22 MED ORDER — PROMETHAZINE HCL 25 MG PO TABS: 12.5000 mg | ORAL_TABLET | Freq: Four times a day (QID) | ORAL | 5 refills | Status: DC | PRN

## 2017-10-22 NOTE — BH Specialist Note (Signed)
Integrated Behavioral Health Initial Visit  MRN: [id number] Name: Margaurite Salido  Number of [facility] Clinician visits:: 1/6 Session Start time: 1:50  Session End time: 2:10 Total time: 20 minutes  Type of Service: [place] Interpretor:No. Interpretor Name and Language: n/a   Warm Hand Off Completed.       SUBJECTIVE: Krystyne Tewksbury is a 26 y.o. female accompanied by n/a Patient was referred by Fatima Blank, CNM for depression and anxiety. Patient reports the following symptoms/concerns: Pt states her primary concern today is feeling an increase in depression and anxiety during pregnancy, and stress over being unfamiliar with local/[place] resources.  Duration of problem: Current pregnancy; Severity of problem: moderate  OBJECTIVE: Mood: Anxious and Affect: Appropriate Risk of harm to self or others: No plan to harm self or others  LIFE CONTEXT: Family and Social: Lives with 26yo; moved to [place] two years ago, [place] more recent  School/Work: Works Surveyor, quantity: Used to take walks, but uncertain where to walk in [place]: Current pregnancy; end relationship with FOB  GOALS ADDRESSED: Patient will: 1. Reduce symptoms of: anxiety, depression and stress 2. Increase knowledge and/or ability of: self-management skills  3. Demonstrate ability to: Increase healthy adjustment to current life circumstances  INTERVENTIONS: Interventions utilized: Solution-Focused Strategies and Psychoeducation and/or Health Education  Standardized Assessments completed: GAD-7 and PHQ 9  ASSESSMENT: Patient currently experiencing Adjustment disorder with mixed anxious and depressed mood.   Patient may benefit from psychoeducation and brief therapeutic interventions regarding coping with symptoms of  Anxiety and depression, along with community resources.  PLAN: 1. Follow up with behavioral health  clinician on : One month 2. Behavioral recommendations:  -Consider [place] for local walking trails -Consider apps for additional self-coping -Consider appropriate community resources(WIC,[facility],and see below) -Read educational materials regarding coping with symptoms of  Anxiety and depression 3. Referral(s): Integrated Orthoptist (In Clinic) and [employer]:  Haematologist, Housing and Transportation 4. "From scale of 1-10, how likely are you to follow plan?": 8  Garlan Fair, LCSW  Depression screen [facility] 2/9 10/22/2017  Decreased Interest 2  Down, Depressed, Hopeless 1  PHQ - 2 Score 3  Altered sleeping 2  Tired, decreased energy 3  Change in appetite 3  Feeling bad or failure about yourself  0  Trouble concentrating 1  Moving slowly or fidgety/restless 2  Suicidal thoughts 0  PHQ-9 Score 14   GAD 7 : Generalized Anxiety Score 10/22/2017  Nervous, Anxious, on Edge 3  Control/stop worrying 2  Worry too much - different things 3  Trouble relaxing 2  Restless 1  Easily annoyed or irritable 3  Afraid - awful might happen 1  Total GAD 7 Score 15

## 2017-10-22 NOTE — Patient Instructions (Signed)
First Trimester of Pregnancy The first trimester of pregnancy is from week 1 until the end of week 13 (months 1 through 3). During this time, your baby will begin to develop inside you. At 6-8 weeks, the eyes and face are formed, and the heartbeat can be seen on ultrasound. At the end of 12 weeks, all the baby's organs are formed. Prenatal care is all the medical care you receive before the birth of your baby. Make sure you get good prenatal care and follow all of your doctor's instructions. Follow these instructions at home: Medicines  Take over-the-counter and prescription medicines only as told by your doctor. Some medicines are safe and some medicines are not safe during pregnancy.  Take a prenatal vitamin that contains at least 600 micrograms (mcg) of folic acid.  If you have trouble pooping (constipation), take medicine that will make your stool soft (stool softener) if your doctor approves. Eating and drinking  Eat regular, healthy meals.  Your doctor will tell you the amount of weight gain that is right for you.  Avoid raw meat and uncooked cheese.  If you feel sick to your stomach (nauseous) or throw up (vomit): ? Eat 4 or 5 small meals a day instead of 3 large meals. ? Try eating a few soda crackers. ? Drink liquids between meals instead of during meals.  To prevent constipation: ? Eat foods that are high in fiber, like fresh fruits and vegetables, whole grains, and beans. ? Drink enough fluids to keep your pee (urine) clear or pale yellow. Activity  Exercise only as told by your doctor. Stop exercising if you have cramps or pain in your lower belly (abdomen) or low back.  Do not exercise if it is too hot, too humid, or if you are in a place of great height (high altitude).  Try to avoid standing for long periods of time. Move your legs often if you must stand in one place for a long time.  Avoid heavy lifting.  Wear low-heeled shoes. Sit and stand up straight.  You  can have sex unless your doctor tells you not to. Relieving pain and discomfort  Wear a good support bra if your breasts are sore.  Take warm water baths (sitz baths) to soothe pain or discomfort caused by hemorrhoids. Use hemorrhoid cream if your doctor says it is okay.  Rest with your legs raised if you have leg cramps or low back pain.  If you have puffy, bulging veins (varicose veins) in your legs: ? Wear support hose or compression stockings as told by your doctor. ? Raise (elevate) your feet for 15 minutes, 3-4 times a day. ? Limit salt in your food. Prenatal care  Schedule your prenatal visits by the twelfth week of pregnancy.  Write down your questions. Take them to your prenatal visits.  Keep all your prenatal visits as told by your doctor. This is important. Safety  Wear your seat belt at all times when driving.  Make a list of emergency phone numbers. The list should include numbers for family, friends, the hospital, and police and fire departments. General instructions  Ask your doctor for a referral to a local prenatal class. Begin classes no later than at the start of month 6 of your pregnancy.  Ask for help if you need counseling or if you need help with nutrition. Your doctor can give you advice or tell you where to go for help.  Do not use hot tubs, steam rooms, or   saunas.  Do not douche or use tampons or scented sanitary pads.  Do not cross your legs for long periods of time.  Avoid all herbs and alcohol. Avoid drugs that are not approved by your doctor.  Do not use any tobacco products, including cigarettes, chewing tobacco, and electronic cigarettes. If you need help quitting, ask your doctor. You may get counseling or other support to help you quit.  Avoid cat litter boxes and soil used by cats. These carry germs that can cause birth defects in the baby and can cause a loss of your baby (miscarriage) or stillbirth.  Visit your dentist. At home, brush  your teeth with a soft toothbrush. Be gentle when you floss. Contact a doctor if:  You are dizzy.  You have mild cramps or pressure in your lower belly.  You have a nagging pain in your belly area.  You continue to feel sick to your stomach, you throw up, or you have watery poop (diarrhea).  You have a bad smelling fluid coming from your vagina.  You have pain when you pee (urinate).  You have increased puffiness (swelling) in your face, hands, legs, or ankles. Get help right away if:  You have a fever.  You are leaking fluid from your vagina.  You have spotting or bleeding from your vagina.  You have very bad belly cramping or pain.  You gain or lose weight rapidly.  You throw up blood. It may look like coffee grounds.  You are around people who have [place] measles, fifth disease, or chickenpox.  You have a very bad headache.  You have shortness of breath.  You have any kind of trauma, such as from a fall or a car accident. Summary  The first trimester of pregnancy is from week 1 until the end of week 13 (months 1 through 3).  To take care of yourself and your unborn baby, you will need to eat healthy meals, take medicines only if your doctor tells you to do so, and do activities that are safe for you and your baby.  Keep all follow-up visits as told by your doctor. This is important as your doctor will have to ensure that your baby is healthy and growing well. This information is not intended to replace advice given to you by your health care provider. Make sure you discuss any questions you have with your health care provider. Document Released: 04/09/2008 Document Revised: 10/30/2016 Document Reviewed: 10/30/2016 Elsevier Interactive Patient Education  2017 [employer]  

## 2017-10-22 NOTE — Progress Notes (Signed)
Positive PHQ9

## 2017-10-22 NOTE — Progress Notes (Signed)
   PRENATAL VISIT NOTE  Subjective:  Krystal Garrett is a 26 y.o. G3P1011 at [redacted]w[redacted]d being seen today for first prenatal visit.  She is currently monitored for the following issues for this low-risk pregnancy and has Supervision of other normal pregnancy, antepartum on their problem list.  Patient reports nausea.   Contractions: Not present. Vag. Bleeding: None.  Movement: Absent. Denies leaking of fluid.   The following portions of the patient's history were reviewed and updated as appropriate: allergies, current medications, past family history, past medical history, past social history, past surgical history and problem list. Problem list updated.  Objective:   Vitals:   10/22/17 1256  BP: 112/61  Pulse: 88  Weight: 169 lb (76.7 kg)    Fetal Status: Fetal Heart Rate (bpm): 163   Movement: Absent     General:  Alert, oriented and cooperative. Patient is in no acute distress.  Skin: Skin is warm and dry. No rash noted.   Cardiovascular: Normal heart rate noted  Respiratory: Normal respiratory effort, no problems with respiration noted  Abdomen: Soft, gravid, appropriate for gestational age.  Pain/Pressure: Present     Pelvic: Cervical exam performed        Extremities: Normal range of motion.  Edema: Trace  Mental Status:  Normal mood and affect. Normal behavior. Normal judgment and thought content.   Assessment and Plan:  Pregnancy: G3P1011 at [redacted]w[redacted]d  1. Supervision of other normal pregnancy, antepartum - Discussed health history, routine and optional testing, and anticipatory guidance about next visits with pt.  Pt desires genetic screening so first screen ordered. - POCT urinalysis dip (device) - [place] MFM OB COMP + 14 WK; Future - Babyscripts Schedule Optimization - [place] MFM Fetal Nuchal Translucency; Future - Culture, OB Urine - Obstetric Panel, Including HIV - Cystic Fibrosis Mutation 97 - Cytology - PAP  2. Nausea and vomiting during pregnancy prior to [redacted] weeks  gestation --Pt reports Phenergan helping but she is out of medication - promethazine (PHENERGAN) 25 MG tablet; Take 0.5-1 tablets (12.5-25 mg total) by mouth every 6 (six) hours as needed for nausea or vomiting.  Dispense: 30 tablet; Refill: 5  Preterm labor symptoms and general obstetric precautions including but not limited to vaginal bleeding, contractions, leaking of fluid and fetal movement were reviewed in detail with the patient. Please refer to After Visit Summary for other counseling recommendations.  No Follow-up on file.   Fatima Blank, CNM

## 2017-10-26 LAB — CULTURE, OB URINE

## 2017-10-26 LAB — URINE CULTURE, OB REFLEX

## 2017-10-29 LAB — OBSTETRIC PANEL, INCLUDING HIV
Antibody Screen: NEGATIVE
Basophils Absolute: 0 10*3/uL (ref 0.0–0.2)
Basos: 0 %
EOS (ABSOLUTE): 0.1 10*3/uL (ref 0.0–0.4)
EOS: 1 %
HEMATOCRIT: 34 % (ref 34.0–46.6)
HEMOGLOBIN: 11.3 g/dL (ref 11.1–15.9)
HEP B S AG: NEGATIVE
HIV Screen 4th Generation wRfx: NONREACTIVE
IMMATURE GRANS (ABS): 0 10*3/uL (ref 0.0–0.1)
IMMATURE GRANULOCYTES: 0 %
LYMPHS: 18 %
Lymphocytes Absolute: 2.3 10*3/uL (ref 0.7–3.1)
MCH: 30.7 pg (ref 26.6–33.0)
MCHC: 33.2 g/dL (ref 31.5–35.7)
MCV: 92 fL (ref 79–97)
MONOCYTES: 6 %
MONOS ABS: 0.8 10*3/uL (ref 0.1–0.9)
NEUTROS PCT: 75 %
Neutrophils Absolute: 9.9 10*3/uL — ABNORMAL HIGH (ref 1.4–7.0)
Platelets: 396 10*3/uL — ABNORMAL HIGH (ref 150–379)
RBC: 3.68 x10E6/uL — AB (ref 3.77–5.28)
RDW: 13.3 % (ref 12.3–15.4)
RH TYPE: NEGATIVE
RPR: NONREACTIVE
RUBELLA: 16.5 {index} (ref >0.99)
WBC: 13.2 10*3/uL — ABNORMAL HIGH (ref 3.4–10.8)

## 2017-10-29 LAB — CYSTIC FIBROSIS MUTATION 97: Interpretation: NOT DETECTED

## 2017-10-31 ENCOUNTER — Encounter (HOSPITAL_COMMUNITY): Payer: Self-pay | Admitting: Advanced Practice Midwife

## 2017-10-31 LAB — CYTOLOGY - PAP
CHLAMYDIA, DNA PROBE: NEGATIVE
DIAGNOSIS: UNDETERMINED — AB
HPV (WINDOPATH): DETECTED — AB
Neisseria Gonorrhea: NEGATIVE

## 2017-11-02 ENCOUNTER — Encounter: Payer: Self-pay | Admitting: Advanced Practice Midwife

## 2017-11-02 ENCOUNTER — Telehealth: Payer: Self-pay | Admitting: Advanced Practice Midwife

## 2017-11-02 DIAGNOSIS — R87610 Atypical squamous cells of undetermined significance on cytologic smear of cervix (ASC-US): Secondary | ICD-10-CM | POA: Insufficient documentation

## 2017-11-02 DIAGNOSIS — R87810 Cervical high risk human papillomavirus (HPV) DNA test positive: Secondary | ICD-10-CM

## 2017-11-02 NOTE — Telephone Encounter (Signed)
Pt Pap positive for ASCUS with positive HPV, per ASCCP guidelines needs colpo. Consult Dr Ilda Basset, if pt with no hx abnormal pap with high grade results and with good follow up, attendance at prenatal visits etc, Ok to defer colpo to postpartum.  Called pt to let her know about results and about needed procedure postpartum.  Left message for pt return call about lab results.

## 2017-11-02 NOTE — Telephone Encounter (Signed)
Pt returned call. Discussed ASCUS with positive HPV results with pt and recommendation for PP colposcopy. Pt with no prior history of abnormal Pap.  Questions about Pap results and HPV answered.  Pt to call office with any further questions.

## 2017-11-05 NOTE — L&D Delivery Note (Signed)
Delivery Note At  a viable female was delivered via  (Presentation:Vertex ;  LOA).  APGAR:9 ,9 ; weight  .   Placenta status:spont , shultz.  Cord:3vc  with the following complications:none .  Cord pH: n/a  Anesthesia: epidural  Episiotomy:  none Lacerations:  none Suture Repair: n/a Est. Blood Loss 250(mL):    Mom to postpartum.  Baby to Couplet care / Skin to Skin.  Koren Shiver 05/04/2018, 8:49 AM

## 2017-11-06 ENCOUNTER — Other Ambulatory Visit: Payer: Self-pay | Admitting: Advanced Practice Midwife

## 2017-11-06 ENCOUNTER — Encounter (HOSPITAL_COMMUNITY): Payer: Self-pay

## 2017-11-06 ENCOUNTER — Ambulatory Visit (HOSPITAL_COMMUNITY): Admission: RE | Admit: 2017-11-06 | Payer: Medicaid Other | Source: Ambulatory Visit

## 2017-11-06 ENCOUNTER — Ambulatory Visit (HOSPITAL_COMMUNITY)
Admission: RE | Admit: 2017-11-06 | Discharge: 2017-11-06 | Disposition: A | Discharge status: Home / Self Care | Payer: Medicaid Other | Source: Ambulatory Visit | Attending: Advanced Practice Midwife | Admitting: Advanced Practice Midwife

## 2017-11-06 DIAGNOSIS — Z3689 Encounter for other specified antenatal screening: Secondary | ICD-10-CM | POA: Diagnosis present

## 2017-11-06 DIAGNOSIS — Z3480 Encounter for supervision of other normal pregnancy, unspecified trimester: Secondary | ICD-10-CM

## 2017-11-06 DIAGNOSIS — Z3682 Encounter for antenatal screening for nuchal translucency: Secondary | ICD-10-CM | POA: Insufficient documentation

## 2017-11-06 DIAGNOSIS — Z3A14 14 weeks gestation of pregnancy: Secondary | ICD-10-CM

## 2017-11-06 DIAGNOSIS — Z3A13 13 weeks gestation of pregnancy: Secondary | ICD-10-CM | POA: Diagnosis not present

## 2017-11-07 ENCOUNTER — Other Ambulatory Visit: Payer: Self-pay | Admitting: Advanced Practice Midwife

## 2017-11-07 DIAGNOSIS — Z3689 Encounter for other specified antenatal screening: Secondary | ICD-10-CM

## 2017-11-07 DIAGNOSIS — Z3A14 14 weeks gestation of pregnancy: Secondary | ICD-10-CM

## 2017-11-12 ENCOUNTER — Encounter: Payer: Self-pay | Admitting: Family Medicine

## 2017-11-12 DIAGNOSIS — R8271 Bacteriuria: Secondary | ICD-10-CM | POA: Insufficient documentation

## 2017-11-12 DIAGNOSIS — O26899 Other specified pregnancy related conditions, unspecified trimester: Secondary | ICD-10-CM

## 2017-11-12 DIAGNOSIS — Z6791 Unspecified blood type, Rh negative: Secondary | ICD-10-CM | POA: Insufficient documentation

## 2017-11-21 ENCOUNTER — Ambulatory Visit (INDEPENDENT_AMBULATORY_CARE_PROVIDER_SITE_OTHER): Payer: Medicaid Other | Admitting: Clinical

## 2017-11-21 ENCOUNTER — Ambulatory Visit (INDEPENDENT_AMBULATORY_CARE_PROVIDER_SITE_OTHER): Payer: Medicaid Other | Admitting: Student

## 2017-11-21 DIAGNOSIS — Z3480 Encounter for supervision of other normal pregnancy, unspecified trimester: Secondary | ICD-10-CM

## 2017-11-21 DIAGNOSIS — F4323 Adjustment disorder with mixed anxiety and depressed mood: Secondary | ICD-10-CM

## 2017-11-21 DIAGNOSIS — G43819 Other migraine, intractable, without status migrainosus: Secondary | ICD-10-CM

## 2017-11-21 DIAGNOSIS — G43909 Migraine, unspecified, not intractable, without status migrainosus: Secondary | ICD-10-CM | POA: Insufficient documentation

## 2017-11-21 MED ORDER — PHENYLEPHRINE-APAP-GUAIFENESIN 5-325-200 MG PO TABS: 1.0000 | ORAL_TABLET | Freq: Two times a day (BID) | ORAL | 0 refills | Status: DC

## 2017-11-21 MED ORDER — RANITIDINE HCL 150 MG PO TABS: 150.0000 mg | ORAL_TABLET | Freq: Two times a day (BID) | ORAL | 2 refills | Status: DC

## 2017-11-21 MED ORDER — PRENATAL VITAMIN 27-0.8 MG PO TABS: 1.0000 | ORAL_TABLET | Freq: Once | ORAL | 2 refills | Status: AC

## 2017-11-21 NOTE — BH Specialist Note (Signed)
Integrated Behavioral Health Initial Visit  MRN: [id number] Name: Krystal Garrett  Number of [place] Clinician visits:: 2/6 Session Start time: 2:45  Session End time: 3:35 Total time: 45 minutes  Type of Service: [place] Interpretor:No. Interpretor Name and Language: n/a   Warm Hand Off Completed.       SUBJECTIVE: Krystal Garrett is a 27 y.o. female accompanied by n/a Patient was referred by Fatima Blank, CNM for depression and anxiety. Patient reports the following symptoms/concerns: Pt states that her primary concerns today are feeling somewhat stressed and anxious about finances in the future, and the possibility of continued government shutdown affecting necessary services, but she is keeping a positive outlook, and taking positive steps to prepare for her family's future. Pt is feeling less depressed after setting down healthy boundaries with FOB.  Duration of problem: Current pregnancy; Severity f problem: moderate  OBJECTIVE: Mood: Anxious and Affect: Appropriate Risk of harm to self or others: No plan to harm self or others  LIFE CONTEXT: Family and Social: Lives with 26yo, [place] resident 2 years, recent move to [place] School/Work: Works fulltime hotel/breakfast service Self-Care: Sleeping and eating well, no substances, keeping a positive outlook and planning for the future Life Changes: Current pregnancy, relationship with FOB ended   GOALS ADDRESSED: Patient will: 1. Reduce symptoms of: anxiety and stress 2. Increase knowledge and/or ability of: stress reduction  3. Demonstrate ability to: Increase healthy adjustment to current life circumstances  INTERVENTIONS: Interventions utilized: Solution-Focused Strategies and Link to [employer]  Standardized Assessments completed: GAD-7 and PHQ 9  ASSESSMENT: Patient currently experiencing Adjustment disorder with anxious and depressed mood.    Patient may benefit from continued brief therapeutic interventions regarding coping with symptoms of anxiety.  PLAN: 1. Follow up with behavioral health clinician on : One month 2. Behavioral recommendations:  -Continue keeping a positive outlook on life, setting healthy interpersonal boundaries and looking forward to the future -Continue utilizing community resources, as needed 3. Referral(s): [place] [facility]) 4. "From scale of 1-10, how likely are you to follow plan?": 9  Garlan Fair, LCSW  Depression screen [facility] 2/9 11/21/2017 10/22/2017  Decreased Interest 1 2  Down, Depressed, Hopeless 1 1  PHQ - 2 Score 2 3  Altered sleeping 2 2  Tired, decreased energy 3 3  Change in appetite 0 3  Feeling bad or failure about yourself  0 0  Trouble concentrating 0 1  Moving slowly or fidgety/restless 1 2  Suicidal thoughts 0 0  PHQ-9 Score 8 14   GAD 7 : Generalized Anxiety Score 11/21/2017 10/22/2017  Nervous, Anxious, on Edge 2 3  Control/stop worrying 2 2  Worry too much - different things 2 3  Trouble relaxing 2 2  Restless 2 1  Easily annoyed or irritable 2 3  Afraid - awful might happen 2 1  Total GAD 7 Score 14 15

## 2017-11-21 NOTE — Patient Instructions (Signed)
Upper Respiratory Infection, Adult Most upper respiratory infections (URIs) are caused by a virus. A URI affects the nose, throat, and upper air passages. The most common type of URI is often called "the common cold." Follow these instructions at home:  Take medicines only as told by your doctor.  Gargle warm saltwater or take cough drops to comfort your throat as told by your doctor.  Use a warm mist humidifier or inhale steam from a shower to increase air moisture. This may make it easier to breathe.  Drink enough fluid to keep your pee (urine) clear or pale yellow.  Eat soups and other clear broths.  Have a healthy diet.  Rest as needed.  Go back to work when your fever is gone or your doctor says it is okay. ? You may need to stay home longer to avoid giving your URI to others. ? You can also wear a face mask and wash your hands often to prevent spread of the virus.  Use your inhaler more if you have asthma.  Do not use any tobacco products, including cigarettes, chewing tobacco, or electronic cigarettes. If you need help quitting, ask your doctor. Contact a doctor if:  You are getting worse, not better.  Your symptoms are not helped by medicine.  You have chills.  You are getting more short of breath.  You have brown or red mucus.  You have yellow or brown discharge from your nose.  You have pain in your face, especially when you bend forward.  You have a fever.  You have puffy (swollen) neck glands.  You have pain while swallowing.  You have white areas in the back of your throat. Get help right away if:  You have very bad or constant: ? Headache. ? Ear pain. ? Pain in your forehead, behind your eyes, and over your cheekbones (sinus pain). ? Chest pain.  You have long-lasting (chronic) lung disease and any of the following: ? Wheezing. ? Long-lasting cough. ? Coughing up blood. ? A change in your usual mucus.  You have a stiff neck.  You have  changes in your: ? Vision. ? Hearing. ? Thinking. ? Mood. This information is not intended to replace advice given to you by your health care provider. Make sure you discuss any questions you have with your health care provider. Document Released: 04/09/2008 Document Revised: 06/24/2016 Document Reviewed: 01/27/2014 Elsevier Interactive Patient Education  2018 Elsevier Inc.  

## 2017-11-22 NOTE — Progress Notes (Signed)
   PRENATAL VISIT NOTE  Subjective:  Krystal Garrett is a 27 y.o. G3P1011 at [redacted]w[redacted]d being seen today for ongoing prenatal care.  She is currently monitored for the following issues for this low-risk pregnancy and has Supervision of other normal pregnancy, antepartum; ASCUS with positive high risk HPV cervical; Rh negative state in antepartum period; Group B streptococcal bacteriuria; and Migraines on their problem list.  Patient reports that she feels like she is developing a cold. She has nasal stuffiness. Patient also has HA that starts in the evening and keeps her awake at night. Does not have aura but does feel dizzy.  She has not tried anything for it other than Tylenol but she says that Krystal Garrett has worked in the past. She wants to know if she can take Alleve. .  Contractions: Not present. Vag. Bleeding: None.  Movement: Absent. Denies leaking of fluid.   The following portions of the patient's history were reviewed and updated as appropriate: allergies, current medications, past family history, past medical history, past social history, past surgical history and problem list. Problem list updated.  Objective:   Vitals:   11/21/17 1350  BP: 127/79  Pulse: (!) 109  Weight: 175 lb 3.2 oz (79.5 kg)    Fetal Status: Fetal Heart Rate (bpm): 152   Movement: Absent     General:  Alert, oriented and cooperative. Patient is in no acute distress.  Skin: Skin is warm and dry. No rash noted.   Cardiovascular: Normal heart rate noted  Respiratory: Normal respiratory effort, no problems with respiration noted  Abdomen: Soft, gravid, appropriate for gestational age.  Pain/Pressure: Present     Pelvic: Cervical exam deferred        Extremities: Normal range of motion.  Edema: Trace  Mental Status:  Normal mood and affect. Normal behavior. Normal judgment and thought content.   Assessment and Plan:  Pregnancy: G3P1011 at [redacted]w[redacted]d  1. Other migraine without status migrainosus, intractable Recommedned  Alleve until 28 weeks; patient will start with this. If her HA is not relieved, she will send a MyChart message to me to figure out next steps and possible HA referral.  -reviewed safe medications for cold relief in pregnancy 2. Supervision of normal pregnancy Doing well otherwise.   Preterm labor symptoms and general obstetric precautions including but not limited to vaginal bleeding, contractions, leaking of fluid and fetal movement were reviewed in detail with the patient. Please refer to After Visit Summary for other counseling recommendations.  Return in about 4 weeks (around 12/19/2017).   Krystal Garrett, CNM

## 2017-11-28 ENCOUNTER — Other Ambulatory Visit: Payer: Self-pay | Admitting: Student

## 2017-11-28 ENCOUNTER — Encounter: Payer: Self-pay | Admitting: Student

## 2017-11-28 ENCOUNTER — Telehealth: Payer: Self-pay | Admitting: Student

## 2017-11-28 DIAGNOSIS — G43819 Other migraine, intractable, without status migrainosus: Secondary | ICD-10-CM

## 2017-11-28 MED ORDER — SUMATRIPTAN SUCCINATE 50 MG PO TABS: 50.0000 mg | ORAL_TABLET | ORAL | 1 refills | Status: DC | PRN

## 2017-11-28 MED ORDER — SUMATRIPTAN SUCCINATE 50 MG PO TABS: 50.0000 mg | ORAL_TABLET | ORAL | 0 refills | Status: DC | PRN

## 2017-11-28 NOTE — Progress Notes (Signed)
Sumtriptan:   -50 mg to start and then if no response, another 50 two hours later.  -Flexeril

## 2017-11-29 ENCOUNTER — Telehealth: Payer: Self-pay | Admitting: Student

## 2017-11-29 DIAGNOSIS — O26899 Other specified pregnancy related conditions, unspecified trimester: Principal | ICD-10-CM

## 2017-11-29 DIAGNOSIS — Z3480 Encounter for supervision of other normal pregnancy, unspecified trimester: Secondary | ICD-10-CM

## 2017-11-29 DIAGNOSIS — R8271 Bacteriuria: Secondary | ICD-10-CM

## 2017-11-29 DIAGNOSIS — Z6791 Unspecified blood type, Rh negative: Secondary | ICD-10-CM

## 2017-11-29 NOTE — Telephone Encounter (Signed)
Spoke with patient; she will try the Sumatriptan if she gets another migraine and will let me know if it works or not. She is waiting for call from Rafael Capo for a HA appointment.

## 2017-12-02 ENCOUNTER — Telehealth: Payer: Self-pay | Admitting: Radiology

## 2017-12-02 NOTE — Telephone Encounter (Signed)
Left message to call [facility] to schedule New Headache appt with [facility].

## 2017-12-08 ENCOUNTER — Inpatient Hospital Stay (HOSPITAL_COMMUNITY)
Admission: AD | Admit: 2017-12-08 | Discharge: 2017-12-08 | Disposition: A | Discharge status: Home / Self Care | Payer: Medicaid Other | Source: Ambulatory Visit | Attending: Obstetrics & Gynecology | Admitting: Obstetrics & Gynecology

## 2017-12-08 ENCOUNTER — Encounter (HOSPITAL_COMMUNITY): Payer: Self-pay

## 2017-12-08 DIAGNOSIS — O26899 Other specified pregnancy related conditions, unspecified trimester: Secondary | ICD-10-CM

## 2017-12-08 DIAGNOSIS — O99612 Diseases of the digestive system complicating pregnancy, second trimester: Secondary | ICD-10-CM | POA: Insufficient documentation

## 2017-12-08 DIAGNOSIS — O26892 Other specified pregnancy related conditions, second trimester: Secondary | ICD-10-CM

## 2017-12-08 DIAGNOSIS — O368120 Decreased fetal movements, second trimester, not applicable or unspecified: Secondary | ICD-10-CM | POA: Diagnosis present

## 2017-12-08 DIAGNOSIS — Z87891 Personal history of nicotine dependence: Secondary | ICD-10-CM | POA: Diagnosis not present

## 2017-12-08 DIAGNOSIS — R197 Diarrhea, unspecified: Secondary | ICD-10-CM | POA: Diagnosis not present

## 2017-12-08 DIAGNOSIS — Z3A18 18 weeks gestation of pregnancy: Secondary | ICD-10-CM | POA: Diagnosis not present

## 2017-12-08 DIAGNOSIS — A084 Viral intestinal infection, unspecified: Secondary | ICD-10-CM | POA: Insufficient documentation

## 2017-12-08 LAB — COMPREHENSIVE METABOLIC PANEL
ALBUMIN: 3.2 g/dL — AB (ref 3.5–5.0)
ALK PHOS: 52 U/L (ref 38–126)
ALT: 19 U/L (ref 14–54)
AST: 15 U/L (ref 15–41)
Anion gap: 8 (ref 5–15)
BILIRUBIN TOTAL: 0.3 mg/dL (ref 0.3–1.2)
BUN: 7 mg/dL (ref 6–20)
CALCIUM: 9.1 mg/dL (ref 8.9–10.3)
CO2: 22 mmol/L (ref 22–32)
Chloride: 105 mmol/L (ref 101–111)
Creatinine, Ser: 0.5 mg/dL (ref 0.44–1.00)
GFR calc Af Amer: >60 mL/min (ref >60)
GFR calc non Af Amer: >60 mL/min (ref >60)
GLUCOSE: 93 mg/dL (ref 65–99)
POTASSIUM: 4.2 mmol/L (ref 3.5–5.1)
SODIUM: 135 mmol/L (ref 135–145)
Total Protein: 6.3 g/dL — ABNORMAL LOW (ref 6.5–8.1)

## 2017-12-08 LAB — URINALYSIS, ROUTINE W REFLEX MICROSCOPIC
Bilirubin Urine: NEGATIVE
Glucose, UA: NEGATIVE mg/dL
Hgb urine dipstick: NEGATIVE
Ketones, ur: NEGATIVE mg/dL
Nitrite: NEGATIVE
PROTEIN: NEGATIVE mg/dL
Specific Gravity, Urine: 1.01 (ref 1.005–1.030)
pH: 7 (ref 5.0–8.0)

## 2017-12-08 LAB — CBC
HEMATOCRIT: 31.3 % — AB (ref 36.0–46.0)
HEMOGLOBIN: 10.6 g/dL — AB (ref 12.0–15.0)
MCH: 31.5 pg (ref 26.0–34.0)
MCHC: 33.9 g/dL (ref 30.0–36.0)
MCV: 92.9 fL (ref 78.0–100.0)
Platelets: 374 10*3/uL (ref 150–400)
RBC: 3.37 MIL/uL — ABNORMAL LOW (ref 3.87–5.11)
RDW: 13.1 % (ref 11.5–15.5)
WBC: 12.9 10*3/uL — AB (ref 4.0–10.5)

## 2017-12-08 LAB — DIFFERENTIAL
BASOS PCT: 0 %
Basophils Absolute: 0 10*3/uL (ref 0.0–0.1)
EOS ABS: 0.1 10*3/uL (ref 0.0–0.7)
EOS PCT: 1 %
LYMPHS ABS: 2.3 10*3/uL (ref 0.7–4.0)
Lymphocytes Relative: 19 %
MONOS PCT: 2 %
Monocytes Absolute: 0.3 10*3/uL (ref 0.1–1.0)
NEUTROS PCT: 78 %
Neutro Abs: 9.8 10*3/uL — ABNORMAL HIGH (ref 1.7–7.7)

## 2017-12-08 LAB — PROTEIN / CREATININE RATIO, URINE
Creatinine, Urine: 59 mg/dL
Total Protein, Urine: <6 mg/dL

## 2017-12-08 LAB — MAGNESIUM: Magnesium: 1.8 mg/dL (ref 1.7–2.4)

## 2017-12-08 NOTE — Discharge Instructions (Signed)
Food Choices to Help Relieve Diarrhea, Adult  When you have diarrhea, the foods you eat and your eating habits are very important. Choosing the right foods and drinks can help:  · Relieve diarrhea.  · Replace lost fluids and nutrients.  · Prevent dehydration.    What general guidelines should I follow?  Relieving diarrhea  · Choose foods with less than 2 g or .07 oz. of fiber per serving.  · Limit fats to less than 8 tsp (38 g or 1.34 oz.) a day.  · Avoid the following:  ? Foods and beverages sweetened with high-fructose corn syrup, honey, or sugar alcohols such as xylitol, sorbitol, and mannitol.  ? Foods that contain a lot of fat or sugar.  ? Fried, greasy, or spicy foods.  ? High-fiber grains, breads, and cereals.  ? Raw fruits and vegetables.  · Eat foods that are rich in probiotics. These foods include dairy products such as yogurt and fermented milk products. They help increase healthy bacteria in the stomach and intestines (gastrointestinal tract, or GI tract).  · If you have lactose intolerance, avoid dairy products. These may make your diarrhea worse.  · Take medicine to help stop diarrhea (antidiarrheal medicine) only as told by your health care provider.  Replacing nutrients  · Eat small meals or snacks every 3–4 hours.  · Eat bland foods, such as white rice, toast, or baked potato, until your diarrhea starts to get better. Gradually reintroduce nutrient-rich foods as tolerated or as told by your health care provider. This includes:  ? Well-cooked protein foods.  ? Peeled, seeded, and soft-cooked fruits and vegetables.  ? Low-fat dairy products.  · Take vitamin and mineral supplements as told by your health care provider.  Preventing dehydration    · Start by sipping water or a special solution to prevent dehydration (oral rehydration solution, ORS). Urine that is clear or pale yellow means that you are getting enough fluid.  · Try to drink at least 8–10 cups of fluid each day to help replace lost  fluids.  · You may add other liquids in addition to water, such as clear juice or decaffeinated sports drinks, as tolerated or as told by your health care provider.  · Avoid drinks with caffeine, such as coffee, tea, or soft drinks.  · Avoid alcohol.  What foods are recommended?  The items listed may not be a complete list. Talk with your health care provider about what dietary choices are best for you.  Grains  White rice. White, French, or pita breads (fresh or toasted), including plain rolls, buns, or bagels. White pasta. Saltine, soda, or graham crackers. Pretzels. Low-fiber cereal. Cooked cereals made with water (such as cornmeal, farina, or cream cereals). Plain muffins. Matzo. Melba toast. Zwieback.  Vegetables  Potatoes (without the skin). Most well-cooked and canned vegetables without skins or seeds. Tender lettuce.  Fruits  Apple sauce. Fruits canned in juice. Cooked apricots, cherries, grapefruit, peaches, pears, or plums. Fresh bananas and cantaloupe.  Meats and other protein foods  Baked or boiled chicken. Eggs. Tofu. Fish. Seafood. Smooth nut butters. Ground or well-cooked tender beef, ham, veal, lamb, pork, or poultry.  Dairy  Plain yogurt, kefir, and unsweetened liquid yogurt. Lactose-free milk, buttermilk, skim milk, or soy milk. Low-fat or nonfat hard cheese.  Beverages  Water. Low-calorie sports drinks. Fruit juices without pulp. Strained tomato and vegetable juices. Decaffeinated teas. Sugar-free beverages not sweetened with sugar alcohols. Oral rehydration solutions, if approved by your health care   provider.  Seasoning and other foods  Bouillon, broth, or soups made from recommended foods.  What foods are not recommended?  The items listed may not be a complete list. Talk with your health care provider about what dietary choices are best for you.  Grains  Whole grain, whole wheat, bran, or rye breads, rolls, pastas, and crackers. Wild or brown rice. Whole grain or bran cereals. Barley. Oats  and oatmeal. Corn tortillas or taco shells. Granola. Popcorn.  Vegetables  Raw vegetables. Fried vegetables. Cabbage, broccoli, Brussels sprouts, artichokes, baked beans, beet greens, corn, kale, legumes, peas, sweet potatoes, and yams. Potato skins. Cooked spinach and cabbage.  Fruits  Dried fruit, including raisins and dates. Raw fruits. Stewed or dried prunes. Canned fruits with syrup.  Meat and other protein foods  Fried or fatty meats. Deli meats. Chunky nut butters. Nuts and seeds. Beans and lentils. Bacon. Hot dogs. Sausage.  Dairy  High-fat cheeses. Whole milk, chocolate milk, and beverages made with milk, such as milk shakes. Half-and-half. Cream. sour cream. Ice cream.  Beverages  Caffeinated beverages (such as coffee, tea, soda, or energy drinks). Alcoholic beverages. Fruit juices with pulp. Prune juice. Soft drinks sweetened with high-fructose corn syrup or sugar alcohols. High-calorie sports drinks.  Fats and oils  Butter. Cream sauces. Margarine. Salad oils. Plain salad dressings. Olives. Avocados. Mayonnaise.  Sweets and desserts  Sweet rolls, doughnuts, and sweet breads. Sugar-free desserts sweetened with sugar alcohols such as xylitol and sorbitol.  Seasoning and other foods  Honey. Hot sauce. Chili powder. Gravy. Cream-based or milk-based soups. Pancakes and waffles.  Summary  · When you have diarrhea, the foods you eat and your eating habits are very important.  · Make sure you get at least 8–10 cups of fluid each day, or enough to keep your urine clear or pale yellow.  · Eat bland foods and gradually reintroduce healthy, nutrient-rich foods as tolerated, or as told by your health care provider.  · Avoid high-fiber, fried, greasy, or spicy foods.  This information is not intended to replace advice given to you by your health care provider. Make sure you discuss any questions you have with your health care provider.  Document Released: 01/12/2004 Document Revised: 10/19/2016 Document  Reviewed: 10/19/2016  Elsevier Interactive Patient Education © 2018 Elsevier Inc.

## 2017-12-08 NOTE — MAU Provider Note (Signed)
History  CSN: [id number] Arrival date and time: 12/08/17 1026  None     Chief Complaint  Patient presents with  . Decreased Fetal Movement  . Diarrhea    HPI: Krystal Garrett is a 27 y.o. G3P1011 with IUP at [redacted]w[redacted]d who presents to maternity admissions reporting diarrhea and nausea and vomiting x 3 days. Reports watery and loose stools, about 6-7 day; no blood. Some vomiting, but has been able to keep some fluids and food down. No fever, chills, or body aches.  Endorses rhinorrhea and congestion. No cough or SOB. No known sick contacts. She is also concerned about decreased fetal movement, which is the reason she came in today.   Denies  leakage of fluid or vaginal bleeding. Also denies any abnormal vaginal discharge,  fevers, chills, malaise, dysuria, hematuria, urinary frequency, RUQ/epigastric pain, dizziness/lighreadhess, or headache.   She receives [facility]. Pregnancy complicated by ASCUS pap, GBS bacetiruria, Rh negative, and migraines.   OB History  Gravida Para Term Preterm AB Living  3 1 1  0 1 1  SAB TAB Ectopic Multiple Live Births  1 0 0 0 1    # Outcome Date GA Lbr Len/2nd Weight Sex Delivery Anes PTL Lv  3 Current           2 SAB 05/2015          1 Term 10/27/09 [redacted]w[redacted]d    Vag-Spont   LIV     Past Medical History:  Diagnosis Date  . Anxiety   . Depression   . Gallstone   . Kidney stone   . OCD (obsessive compulsive disorder)    Past Surgical History:  Procedure Laterality Date  . TONSILLECTOMY AND ADENOIDECTOMY     as a child   Family History  Problem Relation Age of Onset  . Hypertension Mother   . Diabetes Mother   . Hypertension Father   . Cancer Maternal Grandmother   . Diabetes Maternal Grandmother   . Hypertension Maternal Grandmother    Social History   Socioeconomic History  . Marital status: Single    Spouse name: Not on file  . Number of children: Not on file  . Years of education: Not on file  . Highest education level: Not on file   Social Needs  . Financial resource strain: Not on file  . Food insecurity - worry: Not on file  . Food insecurity - inability: Not on file  . Transportation needs - medical: Not on file  . Transportation needs - non-medical: Not on file  Occupational History  . Not on file  Tobacco Use  . Smoking status: Former Smoker    Packs/day: 0.25    Types: Cigarettes  . Smokeless tobacco: Never Used  Substance and Sexual Activity  . Alcohol use: No  . Drug use: No  . Sexual activity: Yes    Birth control/protection: None  Other Topics Concern  . Not on file  Social History Narrative  . Not on file   No Known Allergies  Medications Prior to Admission  Medication Sig Dispense Refill Last Dose  . calcium carbonate (TUMS - DOSED IN MG ELEMENTAL CALCIUM) 500 MG chewable tablet Chew 1 tablet 3 (three) times daily as needed by mouth for indigestion or heartburn.   Not Taking  . Phenylephrine-APAP-Guaifenesin 5-325-200 MG TABS Take 1 tablet by mouth 2 (two) times daily. 20 each 0   . Prenatal Vit-Fe Fumarate-FA (PRENATAL MULTIVITAMIN) TABS tablet Take 2 tablets daily at 12 noon  by mouth.   Taking  . promethazine (PHENERGAN) 25 MG tablet Take 0.5-1 tablets (12.5-25 mg total) by mouth every 6 (six) hours as needed for nausea or vomiting. (Patient not taking: Reported on 11/21/2017) 30 tablet 5 Not Taking  . ranitidine (ZANTAC) 150 MG tablet Take 1 tablet (150 mg total) by mouth 2 (two) times daily. 60 tablet 2   . SUMAtriptan (IMITREX) 50 MG tablet Take 1 tablet (50 mg total) by mouth every 2 (two) hours as needed for migraine. Limit 4 tablets in 24/hr. 20 tablet 1     I have reviewed patient's Past Medical Hx, Surgical Hx, Family Hx, Social Hx, medications and allergies.   Review of Systems: Negative except for what is mentioned in HPI.  Physical Exam   Blood pressure (!) 142/73, pulse (!) 103, temperature 99.3 F (37.4 C), temperature source Oral, resp. rate 17, height 5\' 6"  (1.676 m),  weight 180 lb 1.9 oz (81.7 kg), last menstrual period 08/02/2017.  Constitutional: Well-developed, well-nourished female in no acute distress.  HENT: [place]/AT, normal oropharynx mucosa. MMM Eyes: normal conjunctivae, no scleral icterus Cardiovascular: normal rate, regular rhythm Respiratory: normal effort, lungs CTAB.  GI: Abd soft, non-tender, gravid appropriate for gestational age.   GU: Neg CVAT. MSK: Extremities nontender, no edema Neurologic: Alert and oriented x 4. Psych: Normal mood and affect Skin: warm and dry   FHT:  142  MAU Course/MDM:   Nursing notes and VS reviewed. BP slightly elevated. Temp 99.3 Patient seen and examined, as noted above.   Labs ordered: UA, CBC w/ diff, CMP, Mag   Results reviewed:  - Electrolytes, SCr, and LFTs wnl - WBC slightly elevated,  But actually lower than prior (10/22/18)   Repeat Vitals: BP 115/63, HR 84, T 98.80F  Patient not having diarrhea to collect stool sample  Assessment and Plan  Assessment: 1. Viral gastroenteritis   2. Diarrhea during pregnancy     Plan: --Discharge home in stable condition.  --Discussed symptomatic treatment and po fluid hydration. --Discussed return precautions.  --Keep PNV appt next week, and anatomy U/S appt.   Elonna Mcfarlane, Jenne Pane, MD 12/08/2017 10:52 AM  Future Appointments  Date Time Provider [place]  12/13/2017 12:45 PM [place] 2 [facility]-M[facility] M[facility]-US  12/20/2017  1:55 PM [place], [place]

## 2017-12-08 NOTE — MAU Note (Signed)
Patient present with diarrhea x 3 days and nausea.  Cramping and decreased fetal movement.

## 2017-12-13 ENCOUNTER — Ambulatory Visit (HOSPITAL_COMMUNITY)
Admission: RE | Admit: 2017-12-13 | Discharge: 2017-12-13 | Disposition: A | Discharge status: Home / Self Care | Payer: Medicaid Other | Source: Ambulatory Visit | Attending: Advanced Practice Midwife | Admitting: Advanced Practice Midwife

## 2017-12-13 ENCOUNTER — Other Ambulatory Visit: Payer: Self-pay | Admitting: Advanced Practice Midwife

## 2017-12-13 DIAGNOSIS — Z3A19 19 weeks gestation of pregnancy: Secondary | ICD-10-CM | POA: Insufficient documentation

## 2017-12-13 DIAGNOSIS — Z363 Encounter for antenatal screening for malformations: Secondary | ICD-10-CM | POA: Insufficient documentation

## 2017-12-13 DIAGNOSIS — Z3480 Encounter for supervision of other normal pregnancy, unspecified trimester: Secondary | ICD-10-CM | POA: Insufficient documentation

## 2017-12-20 ENCOUNTER — Ambulatory Visit (INDEPENDENT_AMBULATORY_CARE_PROVIDER_SITE_OTHER): Payer: Medicaid Other | Admitting: Student

## 2017-12-20 VITALS — BP 128/75 | HR 98 | Temp 97.7°F | Wt 183.0 lb

## 2017-12-20 DIAGNOSIS — J0100 Acute maxillary sinusitis, unspecified: Secondary | ICD-10-CM

## 2017-12-20 DIAGNOSIS — Z3480 Encounter for supervision of other normal pregnancy, unspecified trimester: Secondary | ICD-10-CM

## 2017-12-20 DIAGNOSIS — Z3482 Encounter for supervision of other normal pregnancy, second trimester: Secondary | ICD-10-CM

## 2017-12-20 DIAGNOSIS — G43819 Other migraine, intractable, without status migrainosus: Secondary | ICD-10-CM

## 2017-12-20 DIAGNOSIS — J329 Chronic sinusitis, unspecified: Secondary | ICD-10-CM | POA: Insufficient documentation

## 2017-12-20 MED ORDER — AZITHROMYCIN 250 MG PO TABS: 500.0000 mg | ORAL_TABLET | Freq: Once | ORAL | 0 refills | Status: AC

## 2017-12-20 NOTE — Patient Instructions (Signed)
Upper Respiratory Infection, Adult Most upper respiratory infections (URIs) are caused by a virus. A URI affects the nose, throat, and upper air passages. The most common type of URI is often called "the common cold." Follow these instructions at home:  Take medicines only as told by your doctor.  Gargle warm saltwater or take cough drops to comfort your throat as told by your doctor.  Use a warm mist humidifier or inhale steam from a shower to increase air moisture. This may make it easier to breathe.  Drink enough fluid to keep your pee (urine) clear or pale yellow.  Eat soups and other clear broths.  Have a healthy diet.  Rest as needed.  Go back to work when your fever is gone or your doctor says it is okay. ? You may need to stay home longer to avoid giving your URI to others. ? You can also wear a face mask and wash your hands often to prevent spread of the virus.  Use your inhaler more if you have asthma.  Do not use any tobacco products, including cigarettes, chewing tobacco, or electronic cigarettes. If you need help quitting, ask your doctor. Contact a doctor if:  You are getting worse, not better.  Your symptoms are not helped by medicine.  You have chills.  You are getting more short of breath.  You have brown or red mucus.  You have yellow or brown discharge from your nose.  You have pain in your face, especially when you bend forward.  You have a fever.  You have puffy (swollen) neck glands.  You have pain while swallowing.  You have white areas in the back of your throat. Get help right away if:  You have very bad or constant: ? Headache. ? Ear pain. ? Pain in your forehead, behind your eyes, and over your cheekbones (sinus pain). ? Chest pain.  You have long-lasting (chronic) lung disease and any of the following: ? Wheezing. ? Long-lasting cough. ? Coughing up blood. ? A change in your usual mucus.  You have a stiff neck.  You have  changes in your: ? Vision. ? Hearing. ? Thinking. ? Mood. This information is not intended to replace advice given to you by your health care provider. Make sure you discuss any questions you have with your health care provider. Document Released: 04/09/2008 Document Revised: 06/24/2016 Document Reviewed: 01/27/2014 Elsevier Interactive Patient Education  2018 Elsevier Inc.  

## 2017-12-20 NOTE — Progress Notes (Signed)
Pt stated having cold symptom and throat is sore.

## 2017-12-20 NOTE — Progress Notes (Signed)
Entered in error

## 2017-12-20 NOTE — Progress Notes (Addendum)
Patient ID: Krystal Garrett, female   DOB: [birth year], 27 y.o.   MRN: [id number]   PRENATAL VISIT NOTE  Subjective:  Krystal Garrett is a 27 y.o. G3P1011 at [redacted]w[redacted]d being seen today for ongoing prenatal care.  She is currently monitored for the following issues for this low-risk pregnancy and has Supervision of other normal pregnancy, antepartum; ASCUS with positive high risk HPV cervical; Rh negative state in antepartum period; Group B streptococcal bacteriuria; Migraines; and URI (upper respiratory infection) on their problem list.  Patient reports that today she feels congestion, sinus headache, and runny nose down the back of her throat. A little cough, No chest pain, her throat feels irritated. She denies body aches. She endorses greenish yellow nasal discharge for 10 days  but her headache is gone.  She has not had to use her sumatriptan.  Contractions: Not present. Vag. Bleeding: None.  Movement: Present. Denies leaking of fluid.   The following portions of the patient's history were reviewed and updated as appropriate: allergies, current medications, past family history, past medical history, past social history, past surgical history and problem list. Problem list updated.  Objective:   Vitals:   12/20/17 1402  BP: 128/75  Pulse: 98  Temp: 97.7 F (36.5 C)  Weight: 183 lb (83 kg)    Fetal Status: Fetal Heart Rate (bpm): 146 Fundal Height: 20 cm Movement: Present     General:  Alert, oriented and cooperative. Patient is in no acute distress.  Skin: Skin is warm and dry. No rash noted.   Cardiovascular: Normal heart rate noted  Respiratory: Normal respiratory effort, no problems with respiration noted. TM is gray, no tragal tenderness. Throat is pink, no erythema. No drainage or abscesses.   Abdomen: Soft, gravid, appropriate for gestational age.  Pain/Pressure: Present     Pelvic: Cervical exam deferred        Extremities: Normal range of motion.  Edema: Trace  Mental Status:  Normal  mood and affect. Normal behavior. Normal judgment and thought content.   Assessment and Plan:  Pregnancy: G3P1011 at [redacted]w[redacted]d  1. Supervision of other normal pregnancy, antepartum  - [place] [facility] OB FOLLOW UP; Future -Advised patient to follow up with Allie Dimmer for migraines as they may come back; she has limited transportation but will consider follow-up.  2. Sinusitis Amoxicillin and Diflucan RX; no fever today and throat is only mildly irritated so deferred strep test and flu swab.   Preterm labor symptoms and general obstetric precautions including but not limited to vaginal bleeding, contractions, leaking of fluid and fetal movement were reviewed in detail with the patient. Please refer to After Visit Summary for other counseling recommendations.  Return in about 4 weeks (around 01/17/2018).   Starr Lake, CNM

## 2017-12-22 ENCOUNTER — Other Ambulatory Visit: Payer: Self-pay | Admitting: Student

## 2017-12-22 MED ORDER — AMOXICILLIN 875 MG PO TABS: 875.0000 mg | ORAL_TABLET | Freq: Two times a day (BID) | ORAL | 0 refills | Status: DC

## 2017-12-22 MED ORDER — FLUCONAZOLE 150 MG PO TABS: 150.0000 mg | ORAL_TABLET | Freq: Every day | ORAL | 0 refills | Status: DC

## 2018-01-10 ENCOUNTER — Ambulatory Visit (HOSPITAL_COMMUNITY)
Admission: RE | Admit: 2018-01-10 | Discharge: 2018-01-10 | Disposition: A | Discharge status: Home / Self Care | Payer: Medicaid Other | Source: Ambulatory Visit | Attending: Student | Admitting: Student

## 2018-01-10 ENCOUNTER — Other Ambulatory Visit: Payer: Self-pay | Admitting: Student

## 2018-01-10 DIAGNOSIS — IMO0002 Reserved for concepts with insufficient information to code with codable children: Secondary | ICD-10-CM

## 2018-01-10 DIAGNOSIS — Z362 Encounter for other antenatal screening follow-up: Secondary | ICD-10-CM | POA: Insufficient documentation

## 2018-01-10 DIAGNOSIS — Z3480 Encounter for supervision of other normal pregnancy, unspecified trimester: Secondary | ICD-10-CM

## 2018-01-10 DIAGNOSIS — Z0489 Encounter for examination and observation for other specified reasons: Secondary | ICD-10-CM

## 2018-01-10 DIAGNOSIS — Z3A23 23 weeks gestation of pregnancy: Secondary | ICD-10-CM

## 2018-01-17 ENCOUNTER — Ambulatory Visit (INDEPENDENT_AMBULATORY_CARE_PROVIDER_SITE_OTHER): Payer: Self-pay | Admitting: Student

## 2018-01-17 VITALS — BP 113/67 | HR 95 | Wt 187.1 lb

## 2018-01-17 DIAGNOSIS — Z3480 Encounter for supervision of other normal pregnancy, unspecified trimester: Secondary | ICD-10-CM

## 2018-01-17 NOTE — Patient Instructions (Addendum)

## 2018-01-17 NOTE — Progress Notes (Signed)
   PRENATAL VISIT NOTE  Subjective:  Krystal Garrett is a 27 y.o. G3P1011 at [redacted]w[redacted]d being seen today for ongoing prenatal care.  She is currently monitored for the following issues for this low-risk pregnancy and has Supervision of other normal pregnancy, antepartum; ASCUS with positive high risk HPV cervical; Rh negative state in antepartum period; Group B streptococcal bacteriuria; Migraines; and Sinusitis on their problem list.  Patient reports no complaints.  Contractions: Not present. Vag. Bleeding: None.  Movement: Present. Denies leaking of fluid.   The following portions of the patient's history were reviewed and updated as appropriate: allergies, current medications, past family history, past medical history, past social history, past surgical history and problem list. Problem list updated.  Objective:   Vitals:   01/17/18 1104  BP: 113/67  Pulse: 95  Weight: 187 lb 1.6 oz (84.9 kg)    Fetal Status: Fetal Heart Rate (bpm): 155 Fundal Height: 23 cm Movement: Present     General:  Alert, oriented and cooperative. Patient is in no acute distress.  Skin: Skin is warm and dry. No rash noted.   Cardiovascular: Normal heart rate noted  Respiratory: Normal respiratory effort, no problems with respiration noted  Abdomen: Soft, gravid, appropriate for gestational age.  Pain/Pressure: Present     Pelvic: Cervical exam deferred        Extremities: Normal range of motion.  Edema: Trace  Mental Status:  Normal mood and affect. Normal behavior. Normal judgment and thought content.   Assessment and Plan:  Pregnancy: G3P1011 at [redacted]w[redacted]d  1. Supervision of other normal pregnancy, antepartum -doing well -discussed 28 wk labs, rhogam, & Tdap at next visit  Preterm labor symptoms and general obstetric precautions including but not limited to vaginal bleeding, contractions, leaking of fluid and fetal movement were reviewed in detail with the patient. Please refer to After Visit Summary for other  counseling recommendations.  Return in about 4 weeks (around 02/14/2018) for Routine OB.   Jorje Guild, NP

## 2018-01-23 ENCOUNTER — Encounter: Payer: Self-pay | Admitting: Student

## 2018-02-13 ENCOUNTER — Encounter: Payer: Self-pay | Admitting: Medical

## 2018-02-14 ENCOUNTER — Encounter: Payer: Self-pay | Admitting: Medical

## 2018-02-14 ENCOUNTER — Ambulatory Visit (INDEPENDENT_AMBULATORY_CARE_PROVIDER_SITE_OTHER): Payer: Medicaid Other | Admitting: Medical

## 2018-02-14 ENCOUNTER — Other Ambulatory Visit: Payer: Medicaid Other

## 2018-02-14 VITALS — BP 122/69 | HR 91 | Wt 193.2 lb

## 2018-02-14 DIAGNOSIS — Z3493 Encounter for supervision of normal pregnancy, unspecified, third trimester: Secondary | ICD-10-CM

## 2018-02-14 DIAGNOSIS — O26899 Other specified pregnancy related conditions, unspecified trimester: Secondary | ICD-10-CM

## 2018-02-14 DIAGNOSIS — R87610 Atypical squamous cells of undetermined significance on cytologic smear of cervix (ASC-US): Secondary | ICD-10-CM

## 2018-02-14 DIAGNOSIS — R87810 Cervical high risk human papillomavirus (HPV) DNA test positive: Secondary | ICD-10-CM

## 2018-02-14 DIAGNOSIS — R8271 Bacteriuria: Secondary | ICD-10-CM

## 2018-02-14 DIAGNOSIS — Z6791 Unspecified blood type, Rh negative: Secondary | ICD-10-CM

## 2018-02-14 DIAGNOSIS — Z3480 Encounter for supervision of other normal pregnancy, unspecified trimester: Secondary | ICD-10-CM

## 2018-02-14 NOTE — Patient Instructions (Signed)

## 2018-02-14 NOTE — Progress Notes (Signed)
   PRENATAL VISIT NOTE  Subjective:  Krystal Garrett is a 27 y.o. G3P1011 at [redacted]w[redacted]d being seen today for ongoing prenatal care.  She is currently monitored for the following issues for this low-risk pregnancy and has Supervision of other normal pregnancy, antepartum; ASCUS with positive high risk HPV cervical; Rh negative state in antepartum period; Group B streptococcal bacteriuria; Migraines; and Sinusitis on their problem list.  Patient reports no complaints.  Contractions: Not present. Vag. Bleeding: None.  Movement: Present. Denies leaking of fluid.   The following portions of the patient's history were reviewed and updated as appropriate: allergies, current medications, past family history, past medical history, past social history, past surgical history and problem list. Problem list updated.  Objective:   Vitals:   02/14/18 1028  BP: 122/69  Pulse: 91  Weight: 193 lb 3.2 oz (87.6 kg)    Fetal Status: Fetal Heart Rate (bpm): 138 Fundal Height: 29 cm Movement: Present     General:  Alert, oriented and cooperative. Patient is in no acute distress.  Skin: Skin is warm and dry. No rash noted.   Cardiovascular: Normal heart rate noted  Respiratory: Normal respiratory effort, no problems with respiration noted  Abdomen: Soft, gravid, appropriate for gestational age.  Pain/Pressure: Present     Pelvic: Cervical exam deferred        Extremities: Normal range of motion.  Edema: Trace  Mental Status: Normal mood and affect. Normal behavior. Normal judgment and thought content.   Assessment and Plan:  Pregnancy: G3P1011 at [redacted]w[redacted]d  1. Supervision of other normal pregnancy, antepartum  2. Rh negative state in antepartum period - Rhogam today   3. ASCUS with positive high risk HPV cervical - Needs PP colpo   4. Group B streptococcal bacteriuria - Treat in labor   Preterm labor symptoms and general obstetric precautions including but not limited to vaginal bleeding, contractions,  leaking of fluid and fetal movement were reviewed in detail with the patient. Please refer to After Visit Summary for other counseling recommendations.  Return in about 2 weeks (around 02/28/2018) for LOB.   Kerry Hough, PA-C

## 2018-02-15 ENCOUNTER — Encounter: Payer: Self-pay | Admitting: Student

## 2018-02-15 DIAGNOSIS — O99013 Anemia complicating pregnancy, third trimester: Secondary | ICD-10-CM | POA: Insufficient documentation

## 2018-02-15 LAB — GLUCOSE TOLERANCE, 2 HOURS W/ 1HR
GLUCOSE, 1 HOUR: 115 mg/dL (ref 65–179)
GLUCOSE, 2 HOUR: 106 mg/dL (ref 65–152)
GLUCOSE, FASTING: 73 mg/dL (ref 65–91)

## 2018-02-15 LAB — CBC
HEMATOCRIT: 28.4 % — AB (ref 34.0–46.6)
HEMOGLOBIN: 9.5 g/dL — AB (ref 11.1–15.9)
MCH: 30.6 pg (ref 26.6–33.0)
MCHC: 33.5 g/dL (ref 31.5–35.7)
MCV: 92 fL (ref 79–97)
Platelets: 529 10*3/uL — ABNORMAL HIGH (ref 150–379)
RBC: 3.1 x10E6/uL — AB (ref 3.77–5.28)
RDW: 13.3 % (ref 12.3–15.4)
WBC: 13.9 10*3/uL — AB (ref 3.4–10.8)

## 2018-02-15 LAB — HIV ANTIBODY (ROUTINE TESTING W REFLEX): HIV Screen 4th Generation wRfx: NONREACTIVE

## 2018-02-15 LAB — RPR: RPR: NONREACTIVE

## 2018-02-18 ENCOUNTER — Encounter: Payer: Self-pay | Admitting: Student

## 2018-02-20 ENCOUNTER — Encounter: Payer: Self-pay | Admitting: *Deleted

## 2018-03-03 ENCOUNTER — Ambulatory Visit (INDEPENDENT_AMBULATORY_CARE_PROVIDER_SITE_OTHER): Payer: Medicaid Other | Admitting: Obstetrics & Gynecology

## 2018-03-03 VITALS — BP 106/70 | HR 104 | Wt 195.8 lb

## 2018-03-03 DIAGNOSIS — Z3480 Encounter for supervision of other normal pregnancy, unspecified trimester: Secondary | ICD-10-CM

## 2018-03-03 DIAGNOSIS — Z3483 Encounter for supervision of other normal pregnancy, third trimester: Secondary | ICD-10-CM

## 2018-03-03 MED ORDER — PANTOPRAZOLE SODIUM 40 MG PO TBEC: 40.0000 mg | DELAYED_RELEASE_TABLET | Freq: Every day | ORAL | 1 refills | Status: DC

## 2018-03-03 NOTE — Patient Instructions (Signed)

## 2018-03-03 NOTE — Progress Notes (Signed)
   PRENATAL VISIT NOTE  Subjective:  Krystal Garrett is a 27 y.o. G3P1011 at [redacted]w[redacted]d being seen today for ongoing prenatal care.  She is currently monitored for the following issues for this low-risk pregnancy and has Supervision of other normal pregnancy, antepartum; ASCUS with positive high risk HPV cervical; Rh negative state in antepartum period; Group B streptococcal bacteriuria; Migraines; Sinusitis; and Anemia affecting pregnancy in third trimester on their problem list.  Patient reports no complaints.  Contractions: Irregular. Vag. Bleeding: None.  Movement: Present. Denies leaking of fluid.   The following portions of the patient's history were reviewed and updated as appropriate: allergies, current medications, past family history, past medical history, past social history, past surgical history and problem list. Problem list updated.  Objective:   Vitals:   03/03/18 1109  BP: 106/70  Pulse: (!) 104  Weight: 195 lb 12.8 oz (88.8 kg)    Fetal Status: Fetal Heart Rate (bpm): 145   Movement: Present     General:  Alert, oriented and cooperative. Patient is in no acute distress.  Skin: Skin is warm and dry. No rash noted.   Cardiovascular: Normal heart rate noted  Respiratory: Normal respiratory effort, no problems with respiration noted  Abdomen: Soft, gravid, appropriate for gestational age.  Pain/Pressure: Present     Pelvic: Cervical exam deferred        Extremities: Normal range of motion.     Mental Status: Normal mood and affect. Normal behavior. Normal judgment and thought content.   Assessment and Plan:  Pregnancy: G3P1011 at [redacted]w[redacted]d  1. Supervision of other normal pregnancy, antepartum Change heartburn medication - pantoprazole (PROTONIX) 40 MG tablet; Take 1 tablet (40 mg total) by mouth daily.  Dispense: 30 tablet; Refill: 1  Preterm labor symptoms and general obstetric precautions including but not limited to vaginal bleeding, contractions, leaking of fluid and fetal  movement were reviewed in detail with the patient. Please refer to After Visit Summary for other counseling recommendations.  Return in about 2 weeks (around 03/17/2018).  No future appointments.  Emeterio Reeve, MD

## 2018-03-13 ENCOUNTER — Ambulatory Visit (INDEPENDENT_AMBULATORY_CARE_PROVIDER_SITE_OTHER): Payer: Medicaid Other | Admitting: Student

## 2018-03-13 DIAGNOSIS — D492 Neoplasm of unspecified behavior of bone, soft tissue, and skin: Secondary | ICD-10-CM

## 2018-03-13 DIAGNOSIS — G5601 Carpal tunnel syndrome, right upper limb: Secondary | ICD-10-CM

## 2018-03-13 DIAGNOSIS — Z3480 Encounter for supervision of other normal pregnancy, unspecified trimester: Secondary | ICD-10-CM

## 2018-03-13 DIAGNOSIS — G5600 Carpal tunnel syndrome, unspecified upper limb: Secondary | ICD-10-CM | POA: Insufficient documentation

## 2018-03-13 DIAGNOSIS — O26899 Other specified pregnancy related conditions, unspecified trimester: Secondary | ICD-10-CM

## 2018-03-13 DIAGNOSIS — Z6791 Unspecified blood type, Rh negative: Secondary | ICD-10-CM

## 2018-03-13 NOTE — Patient Instructions (Signed)
Skin Conditions During Pregnancy Pregnancy affects many parts of your body. One part is your skin. Most skin problems that develop during pregnancy are not serious and are considered a normal part of pregnancy. They go away on their own after the baby is born. Other skin problems may need treatment. What type of skin problems can develop during pregnancy?  Stretch marks. Stretch marks are purple or pink lines on the skin. They may appear on the belly, breasts, thighs, or buttocks. Stretch marks are caused by weight gain that causes the skin to stretch. Stretch marks do not cause problems. Almost all women get them during pregnancy.  Darkening of the skin (hyperpigmentation). The darkening may occur in patches or as a line. Patches may appear on the face, nipples, or genital area. Lines often stretch from the belly button to the pubic area. Hyperpigmentation develops in almost all pregnant women. It is more severe in women with a dark complexion.  Spider angiomas. These are tiny pink or red lines that go out from a center point, like the legs of a spider. Usually, they are on the face, neck, and arms. They do not cause problems. They are most common in women with light complexions.  Palmar erythema. This is a reddening of the palms. It is most common in women with light complexions.  Swelling and redness. This can occur on the face, eyelids, fingers, or toes.  Pruritic urticarial papules and plaques of pregnancy (PUPPP). This is a rash that is itchy, red, and has tiny blisters. The cause is unknown. It usually starts on the abdomen and may affect the arms or legs. It does not affect the face. It usually begins later in pregnancy. About a third of all pregnant women develop this condition. There are no associated problems to the fetus with this rash. Sometimes, oral steroids are used to calm down the itch. The rash clears after the baby is born.  Prurigo of pregnancy. This is a disease in which red  patches and bumps appear on the arms and legs. The cause is unknown. The patches and bumps clear after the baby is born. About a third of pregnant women develop this disease.  Acne. Pimples may develop, including in women who have had clear skin for a long time.  Skin tags. These are small flaps of skin that stick out from the body. They may grow or become darker during pregnancy. They are usually harmless.  Moles. These are flat or slightly raised growths. They are usually round and pink or brown. They may grow or become darker during pregnancy.  Intrahepatic cholestasis of pregnancy. This is a rare condition that causes itchy skin. It may run in families. It increases the risk of complications for the fetus. This condition usually resolves after delivery. It can recur with subsequent pregnancies.  Impetigo herpetiformis. This is a form of a severe skin disease called pustular psoriasis. Usually, delivery is the only method of resolving the condition.  Pruritic folliculitis of pregnancy. This is a rare condition that causes pimple-like skin growths. It develops in the middle or later stages of pregnancy. Its cause is unknown.It usually resolves 2-3 weeks after delivery.  Pemphigoid gestationis. This is a very rare autoimmune disease. It causes a severely itchy rash and blisters. The rash does not appear on the face, scalp, or inside of the mouth. It usually resolves 3 months after delivery. It may recur with subsequent pregnancies. Some pre-existing skin conditions, such as atopic dermatitis, may become worse  during pregnancy. Follow these instructions at home: Different conditions may have different instructions. In general:  Follow all your health care provider's directions about medicines to treat skin problems while you are pregnant. Do not use any over-the-counter medicines (including medicated creams and lotions) until you have checked with your health care provider. Many medicines are not  safe to use when you are pregnant.  Avoid time in the sun. This will help keep your skin from darkening. When you must be outside, use sunscreen and wear a hat with a wide brim to protect your face. The sunscreen should have a SPF of at least 56. This may help limit dark spots that develop when the skin is exposed to the sun.  To avoid problems from stretched skin: ? Do not sit or stand for long periods of time. ? Exercise regularly. This helps keep your skin in good condition.  Use a gentle soap. This helps prevent acne.  Do not get too hot or too sweaty. This makes some skin rashes worse.  Wear loose clothes made of a soft fabric. This prevents skin irritation.  For itching, add oatmeal or cornstarch to your bathwater.  Use a skin moisturizer. Ask your health care provider for suggestions.  This information is not intended to replace advice given to you by your health care provider. Make sure you discuss any questions you have with your health care provider. Document Released: 11/24/2010 Document Revised: 03/29/2016 Document Reviewed: 08/03/2013 Elsevier Interactive Patient Education  [employer].

## 2018-03-13 NOTE — Progress Notes (Signed)
   PRENATAL VISIT NOTE  Subjective:  Krystal Garrett is a 27 y.o. G3P1011 at [redacted]w[redacted]d being seen today for ongoing prenatal care.  She is currently monitored for the following issues for this low-risk pregnancy and has Supervision of other normal pregnancy, antepartum; ASCUS with positive high risk HPV cervical; Rh negative state in antepartum period; Group B streptococcal bacteriuria; Migraines; Sinusitis; Anemia affecting pregnancy in third trimester; and Skin growth on their problem list.  Patient reports new mole on her breast. .  Contractions: Irritability. Vag. Bleeding: None.  Movement: Present. Denies leaking of fluid.   The following portions of the patient's history were reviewed and updated as appropriate: allergies, current medications, past family history, past medical history, past social history, past surgical history and problem list. Problem list updated.  Objective:   Vitals:   03/13/18 1538  BP: 125/72  Pulse: (!) 108  Weight: 195 lb 3.2 oz (88.5 kg)    Fetal Status: Fetal Heart Rate (bpm): 146 Fundal Height: 32 cm Movement: Present     General:  Alert, oriented and cooperative. Patient is in no acute distress.  Skin: Skin is warm and dry. No rash noted.   Cardiovascular: Normal heart rate noted  Respiratory: Normal respiratory effort, no problems with respiration noted  Abdomen: Soft, gravid, appropriate for gestational age.  Pain/Pressure: Present     Pelvic: Cervical exam deferred        Extremities: Normal range of motion.  Edema: Trace  Mental Status: Normal mood and affect. Normal behavior. Normal judgment and thought content.   Assessment and Plan:  Pregnancy: G3P1011 at [redacted]w[redacted]d  1. Rh negative state in antepartum period Received Rhogam at 28 weeks  2. Supervision of other normal pregnancy, antepartum -HA and GERD are improved  3. Skin growth -will do Derm referral  Preterm labor symptoms and general obstetric precautions including but not limited to  vaginal bleeding, contractions, leaking of fluid and fetal movement were reviewed in detail with the patient. Please refer to After Visit Summary for other counseling recommendations.  Return in about 2 weeks (around 03/27/2018).  Future Appointments  Date Time Provider [place]  03/27/2018  4:15 PM Starr Lake, [place], [place]

## 2018-03-27 ENCOUNTER — Ambulatory Visit (INDEPENDENT_AMBULATORY_CARE_PROVIDER_SITE_OTHER): Payer: Medicaid Other | Admitting: Student

## 2018-03-27 VITALS — BP 118/68 | HR 106 | Wt 194.5 lb

## 2018-03-27 DIAGNOSIS — O99013 Anemia complicating pregnancy, third trimester: Secondary | ICD-10-CM

## 2018-03-27 DIAGNOSIS — Z3480 Encounter for supervision of other normal pregnancy, unspecified trimester: Secondary | ICD-10-CM

## 2018-03-27 DIAGNOSIS — G5601 Carpal tunnel syndrome, right upper limb: Secondary | ICD-10-CM

## 2018-03-27 MED ORDER — FERROUS SULFATE 325 (65 FE) MG PO TABS: 325.0000 mg | ORAL_TABLET | Freq: Every day | ORAL | 3 refills | Status: DC

## 2018-03-27 NOTE — Progress Notes (Signed)
PRENATAL VISIT NOTE  Subjective:  Krystal Garrett is a 27 y.o. G3P1011 at [redacted]w[redacted]d being seen today for ongoing prenatal care.  She is currently monitored for the following issues for this low-risk pregnancy and has Supervision of other normal pregnancy, antepartum; ASCUS with positive high risk HPV cervical; Rh negative state in antepartum period; Group B streptococcal bacteriuria; Migraines; Sinusitis; Anemia affecting pregnancy in third trimester; Skin growth; and Carpal tunnel syndrome on their problem list.  Patient reports no complaints.  Contractions: Irritability. Vag. Bleeding: None.  Movement: Present. Denies leaking of fluid.   The following portions of the patient's history were reviewed and updated as appropriate: allergies, current medications, past family history, past medical history, past social history, past surgical history and problem list. Problem list updated.  Objective:   Vitals:   03/27/18 1626  BP: 118/68  Pulse: (!) 106  Weight: 194 lb 8 oz (88.2 kg)    Fetal Status: Fetal Heart Rate (bpm): 133 Fundal Height: 36 cm Movement: Present     General:  Alert, oriented and cooperative. Patient is in no acute distress.  Skin: Skin is warm and dry. No rash noted.   Cardiovascular: Normal heart rate noted  Respiratory: Normal respiratory effort, no problems with respiration noted  Abdomen: Soft, gravid, appropriate for gestational age.  Pain/Pressure: Present     Pelvic: Cervical exam deferred        Extremities: Normal range of motion.  Edema: Trace  Mental Status: Normal mood and affect. Normal behavior. Normal judgment and thought content.   Assessment and Plan:  Pregnancy: G3P1011 at [redacted]w[redacted]d  There are no diagnoses linked to this encounter. Preterm labor symptoms and general obstetric precautions including but not limited to vaginal bleeding, contractions, leaking of fluid and fetal movement were reviewed in detail with the patient. Please refer to After Visit  Summary for other counseling recommendations.  Return in about 2 weeks (around 04/10/2018), or LROB and meet with [name].  Future Appointments  Date Time Provider [place]  04/15/2018  4:15 PM Rasch, Artist Pais, NP [facility]    Rushdon Islam FNP-DNP Student     I confirm that I have verified the information documented in the nurse practitioner student's note and that I have also personally reperformed the physical exam and all medical decision making activities.     PRENATAL VISIT NOTE  Subjective:  Krystal Garrett is a 27 y.o. G3P1011 at [redacted]w[redacted]d being seen today for ongoing prenatal care.  She is currently monitored for the following issues for this low-risk pregnancy and has Supervision of other normal pregnancy, antepartum; ASCUS with positive high risk HPV cervical; Rh negative state in antepartum period; Group B streptococcal bacteriuria; Migraines; Sinusitis; Anemia affecting pregnancy in third trimester; Skin growth; and Carpal tunnel syndrome on their problem list.  Patient reports still having some carpal tunnel systems. She thought she had the flu earlier this week because her mother tested positive but she has not felt ill, other than a light cold.  Contractions: Irritability. Vag. Bleeding: None.  Movement: Present. Denies leaking of fluid.   The following portions of the patient's history were reviewed and updated as appropriate: allergies, current medications, past family history, past medical history, past social history, past surgical history and problem list. Problem list updated.  Objective:   Vitals:   03/27/18 1626  BP: 118/68  Pulse: (!) 106  Weight: 194 lb 8 oz (88.2 kg)    Fetal Status: Fetal Heart Rate (bpm): 133 Fundal Height: 36  cm Movement: Present     General:  Alert, oriented and cooperative. Patient is in no acute distress.  Skin: Skin is warm and dry. No rash noted.   Cardiovascular: Normal heart rate noted  Respiratory: Normal respiratory  effort, no problems with respiration noted  Abdomen: Soft, gravid, appropriate for gestational age.  Pain/Pressure: Present     Pelvic: Cervical exam deferred        Extremities: Normal range of motion.  Edema: Trace  Mental Status: Normal mood and affect. Normal behavior. Normal judgment and thought content.   Assessment and Plan:  Pregnancy: G3P1011 at [redacted]w[redacted]d  1. Supervision of other normal pregnancy, antepartum -Patient still with anxiety about her finances and family situation; agrees to meet with [name] at next visit. Will send message to [facility] to schedule time with her.   2. Anemia affecting pregnancy in third trimester -Will start iron pills; RX sent.   3. Carpal tunnel syndrome of right wrist -Continue comfort measures; reassured her that her symptoms will resolve once she has the baby.    Preterm labor symptoms and general obstetric precautions including but not limited to vaginal bleeding, contractions, leaking of fluid and fetal movement were reviewed in detail with the patient. Please refer to After Visit Summary for other counseling recommendations.  Return in about 2 weeks (around 04/10/2018), or LROB and meet with [name].  Future Appointments  Date Time Provider [place]  04/15/2018  4:15 PM Rasch, Artist Pais, NP [facility]    Mervyn Skeeters [place], [place]

## 2018-03-28 ENCOUNTER — Telehealth: Payer: Self-pay

## 2018-03-28 NOTE — Telephone Encounter (Signed)
Per Maye Hides, CNM pt needs referral to dermatology for painful mole.

## 2018-04-01 ENCOUNTER — Telehealth: Payer: Self-pay | Admitting: Clinical

## 2018-04-01 NOTE — Telephone Encounter (Signed)
Attempt to follow up for mood check postpartum; no answer, no voicemail, no message left.

## 2018-04-14 NOTE — Telephone Encounter (Signed)
Dermatology referral made to [facility] @ [phone], [phone] [address] [facility] for October 3rd @ 1000.  Pt will notified at prenatal appt scheduled tomorrow 04/15/18.

## 2018-04-15 ENCOUNTER — Ambulatory Visit (INDEPENDENT_AMBULATORY_CARE_PROVIDER_SITE_OTHER): Payer: Medicaid Other | Admitting: Clinical

## 2018-04-15 ENCOUNTER — Other Ambulatory Visit (HOSPITAL_COMMUNITY)
Admission: RE | Admit: 2018-04-15 | Discharge: 2018-04-15 | Disposition: A | Discharge status: Home / Self Care | Payer: Medicaid Other | Source: Ambulatory Visit | Attending: Obstetrics and Gynecology | Admitting: Obstetrics and Gynecology

## 2018-04-15 ENCOUNTER — Ambulatory Visit (INDEPENDENT_AMBULATORY_CARE_PROVIDER_SITE_OTHER): Payer: Medicaid Other | Admitting: Obstetrics and Gynecology

## 2018-04-15 VITALS — BP 137/76 | HR 96 | Wt 194.7 lb

## 2018-04-15 DIAGNOSIS — Z3483 Encounter for supervision of other normal pregnancy, third trimester: Secondary | ICD-10-CM | POA: Diagnosis present

## 2018-04-15 DIAGNOSIS — F4323 Adjustment disorder with mixed anxiety and depressed mood: Secondary | ICD-10-CM | POA: Diagnosis not present

## 2018-04-15 DIAGNOSIS — Z3480 Encounter for supervision of other normal pregnancy, unspecified trimester: Secondary | ICD-10-CM

## 2018-04-15 DIAGNOSIS — Z3009 Encounter for other general counseling and advice on contraception: Secondary | ICD-10-CM | POA: Insufficient documentation

## 2018-04-15 LAB — OB RESULTS CONSOLE GBS: GBS: POSITIVE

## 2018-04-15 NOTE — BH Specialist Note (Signed)
Integrated Behavioral Health Follow Up Visit  MRN: [id number] Name: Krystal Garrett  Number of [place] Clinician visits: 3/6 Session Start time: 4:50  Session End time: 5:07 Total time: 20 minutes  Type of Service: [place] Interpretor:No. Interpretor Name and Language: n/a  SUBJECTIVE: Krystal Garrett is a 27 y.o. female accompanied by n/a Patient was referred by Fatima Blank, CNM for anxiety and depression. Patient reports the following symptoms/concerns: Pt states she is feeling less anxious since her last visit, since she is no longer in relationship with boyfriend, and has been able to access necessary resources in the local community.  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  Maintain reduction of symptoms of: anxiety and depression   INTERVENTIONS: Interventions utilized:  Supportive Counseling and Link to [employer] Standardized Assessments completed: GAD-7 and PHQ 9  ASSESSMENT: Patient currently experiencing Adjustment disorder with mixed anxious and depressed mood  Patient may benefit from continued brief therapeutic interventions regarding maintaining reduction of symptoms of anxiety and depression  PLAN: 1. Follow up with behavioral health clinician on : As needed 2. Behavioral recommendations:  -Continue using self-coping strategies and community resources to maintain reduction of  anxious/depressed symptoms 3. Referral(s): [place] (In Clinic) 4. "From scale of 1-10, how likely are you to follow plan?": 10  Garlan Fair, LCSW  Depression screen [facility] 2/9 04/15/2018 03/27/2018 03/03/2018 01/17/2018 11/21/2017  Decreased Interest 1 3 2 1 1   Down, Depressed, Hopeless 1 1 0 0 1  PHQ - 2 Score 2 4 2 1 2   Altered sleeping 2 3 2 3 2   Tired, decreased energy 2 3 2 3 3   Change in appetite 0 0 0 0 0  Feeling bad or failure about yourself  0 0 0 0 0  Trouble concentrating 0 0 0 0 0  Moving slowly or fidgety/restless 0 0 0 0 1  Suicidal thoughts 0 0 0 0 0  PHQ-9 Score 6 10 6 7 8    GAD 7 : Generalized Anxiety Score 04/15/2018 03/27/2018 03/03/2018 01/17/2018  Nervous, Anxious, on Edge 1 1 2 2   Control/stop worrying 1 2 2  0  Worry too much - different things 1 2 3  0  Trouble relaxing 1 2 2 2   Restless 2 3 3  0  Easily annoyed or irritable 1 2 3 2   Afraid - awful might happen 0 1 2 3   Total GAD 7 Score 7 13 17  9

## 2018-04-15 NOTE — Patient Instructions (Signed)

## 2018-04-15 NOTE — Progress Notes (Signed)
   PRENATAL VISIT NOTE  Subjective:  Krystal Garrett is a 27 y.o. G3P1011 at [redacted]w[redacted]d being seen today for ongoing prenatal care.  She is currently monitored for the following issues for this low-risk pregnancy and has Supervision of other normal pregnancy, antepartum; ASCUS with positive high risk HPV cervical; Rh negative state in antepartum period; Group B streptococcal bacteriuria; Migraines; Sinusitis; Anemia affecting pregnancy in third trimester; Skin growth; Carpal tunnel syndrome; and Unwanted fertility on their problem list.  Patient reports Pressure .  Contractions: Irritability. Vag. Bleeding: None.  Movement: Present. Denies leaking of fluid.   The following portions of the patient's history were reviewed and updated as appropriate: allergies, current medications, past family history, past medical history, past social history, past surgical history and problem list. Problem list updated.  Objective:   Vitals:   04/15/18 1613  BP: 137/76  Pulse: 96  Weight: 194 lb 11.2 oz (88.3 kg)    Fetal Status: Fetal Heart Rate (bpm): 150 Fundal Height: 37 cm Movement: Present     General:  Alert, oriented and cooperative. Patient is in no acute distress.  Skin: Skin is warm and dry. No rash noted.   Cardiovascular: Normal heart rate noted  Respiratory: Normal respiratory effort, no problems with respiration noted  Abdomen: Soft, gravid, appropriate for gestational age.  Pain/Pressure: Present     Pelvic: Cervical exam performed Dilation: 1 Effacement (%): 50 Station: -3  Extremities: Normal range of motion.  Edema: Trace  Mental Status: Normal mood and affect. Normal behavior. Normal judgment and thought content.   Assessment and Plan:  Pregnancy: G3P1011 at [redacted]w[redacted]d  1. Supervision of other normal pregnancy, antepartum  - Culture, beta strep (group b only) - GC/Chlamydia probe amp ([place])not at [facility]  2. Unwanted fertility  BTL papers signed   Term labor symptoms and general  obstetric precautions including but not limited to vaginal bleeding, contractions, leaking of fluid and fetal movement were reviewed in detail with the patient. Please refer to After Visit Summary for other counseling recommendations.  Return in about 1 week (around 04/22/2018).  No future appointments.  Noni Saupe, NP

## 2018-04-17 LAB — GC/CHLAMYDIA PROBE AMP (~~LOC~~) NOT AT ARMC
CHLAMYDIA, DNA PROBE: NEGATIVE
Neisseria Gonorrhea: NEGATIVE

## 2018-04-20 LAB — CULTURE, BETA STREP (GROUP B ONLY): STREP GP B CULTURE: POSITIVE — AB

## 2018-04-23 ENCOUNTER — Ambulatory Visit (INDEPENDENT_AMBULATORY_CARE_PROVIDER_SITE_OTHER): Payer: Medicaid Other | Admitting: Advanced Practice Midwife

## 2018-04-23 ENCOUNTER — Encounter: Payer: Self-pay | Admitting: Advanced Practice Midwife

## 2018-04-23 VITALS — BP 135/81 | HR 80 | Wt 200.0 lb

## 2018-04-23 DIAGNOSIS — Z3480 Encounter for supervision of other normal pregnancy, unspecified trimester: Secondary | ICD-10-CM

## 2018-04-23 DIAGNOSIS — Z3483 Encounter for supervision of other normal pregnancy, third trimester: Secondary | ICD-10-CM

## 2018-04-23 NOTE — Patient Instructions (Signed)

## 2018-04-23 NOTE — Progress Notes (Signed)
Patient had elevated gad7- declines seeing [name] today as she saw her last week. Marcille Buffy aware

## 2018-04-23 NOTE — Progress Notes (Signed)
   PRENATAL VISIT NOTE  Subjective:  Krystal Garrett is a 27 y.o. G3P1011 at [redacted]w[redacted]d being seen today for ongoing prenatal care.  She is currently monitored for the following issues for this low-risk pregnancy and has Supervision of other normal pregnancy, antepartum; ASCUS with positive high risk HPV cervical; Rh negative state in antepartum period; Group B streptococcal bacteriuria; Migraines; Sinusitis; Anemia affecting pregnancy in third trimester; Skin growth; Carpal tunnel syndrome; and Unwanted fertility on their problem list.  Patient reports no complaints.  Contractions: Irritability. Vag. Bleeding: None.  Movement: Present. Denies leaking of fluid.   The following portions of the patient's history were reviewed and updated as appropriate: allergies, current medications, past family history, past medical history, past social history, past surgical history and problem list. Problem list updated.  Objective:   Vitals:   04/23/18 1523  BP: 135/81  Pulse: 80  Weight: 200 lb (90.7 kg)    Fetal Status: Fetal Heart Rate (bpm): 160 Fundal Height: 37 cm Movement: Present     General:  Alert, oriented and cooperative. Patient is in no acute distress.  Skin: Skin is warm and dry. No rash noted.   Cardiovascular: Normal heart rate noted  Respiratory: Normal respiratory effort, no problems with respiration noted  Abdomen: Soft, gravid, appropriate for gestational age.  Pain/Pressure: Present     Pelvic: Cervical exam performed Dilation: 1 Effacement (%): 50 Station: -2  Extremities: Normal range of motion.  Edema: Mild pitting, slight indentation  Mental Status: Normal mood and affect. Normal behavior. Normal judgment and thought content.   Assessment and Plan:  Pregnancy: G3P1011 at [redacted]w[redacted]d  1. Supervision of other normal pregnancy, antepartum - Routine care  Term labor symptoms and general obstetric precautions including but not limited to vaginal bleeding, contractions, leaking of fluid  and fetal movement were reviewed in detail with the patient. Please refer to After Visit Summary for other counseling recommendations.  Return in about 1 week (around 04/30/2018).  No future appointments.  Marcille Buffy, CNM

## 2018-04-30 ENCOUNTER — Other Ambulatory Visit: Payer: Self-pay | Admitting: Obstetrics & Gynecology

## 2018-04-30 DIAGNOSIS — R12 Heartburn: Principal | ICD-10-CM

## 2018-04-30 DIAGNOSIS — Z3480 Encounter for supervision of other normal pregnancy, unspecified trimester: Secondary | ICD-10-CM

## 2018-04-30 DIAGNOSIS — O26899 Other specified pregnancy related conditions, unspecified trimester: Secondary | ICD-10-CM

## 2018-04-30 NOTE — Telephone Encounter (Signed)
Called Damara and left message I got her message and refill request has been sent in ;should be available for pick up after 3. Call us if issues.

## 2018-04-30 NOTE — Telephone Encounter (Signed)
Received a voicemail today from patient asking for refill of protonix . Also noted refill request sent by pharmacy. Refill approved .

## 2018-05-01 ENCOUNTER — Encounter: Payer: Self-pay | Admitting: Student

## 2018-05-02 ENCOUNTER — Encounter: Payer: Self-pay | Admitting: Nurse Practitioner

## 2018-05-03 ENCOUNTER — Encounter (HOSPITAL_COMMUNITY): Payer: Self-pay | Admitting: Family Medicine

## 2018-05-03 ENCOUNTER — Other Ambulatory Visit: Payer: Self-pay

## 2018-05-03 ENCOUNTER — Inpatient Hospital Stay (HOSPITAL_COMMUNITY)
Admission: AD | Admit: 2018-05-03 | Discharge: 2018-05-05 | DRG: 807 | Disposition: A | Discharge status: Home / Self Care | Payer: Medicaid Other | Attending: Obstetrics and Gynecology | Admitting: Obstetrics and Gynecology

## 2018-05-03 DIAGNOSIS — O4290 Premature rupture of membranes, unspecified as to length of time between rupture and onset of labor, unspecified weeks of gestation: Secondary | ICD-10-CM | POA: Diagnosis present

## 2018-05-03 DIAGNOSIS — O4292 Full-term premature rupture of membranes, unspecified as to length of time between rupture and onset of labor: Secondary | ICD-10-CM | POA: Diagnosis present

## 2018-05-03 DIAGNOSIS — O99824 Streptococcus B carrier state complicating childbirth: Secondary | ICD-10-CM | POA: Diagnosis present

## 2018-05-03 DIAGNOSIS — Z6791 Unspecified blood type, Rh negative: Secondary | ICD-10-CM

## 2018-05-03 DIAGNOSIS — O26893 Other specified pregnancy related conditions, third trimester: Secondary | ICD-10-CM | POA: Diagnosis present

## 2018-05-03 DIAGNOSIS — Z3A39 39 weeks gestation of pregnancy: Secondary | ICD-10-CM | POA: Diagnosis not present

## 2018-05-03 DIAGNOSIS — O9902 Anemia complicating childbirth: Secondary | ICD-10-CM | POA: Diagnosis present

## 2018-05-03 DIAGNOSIS — Z87891 Personal history of nicotine dependence: Secondary | ICD-10-CM

## 2018-05-03 DIAGNOSIS — D649 Anemia, unspecified: Secondary | ICD-10-CM | POA: Diagnosis present

## 2018-05-03 DIAGNOSIS — R8271 Bacteriuria: Secondary | ICD-10-CM | POA: Diagnosis present

## 2018-05-03 DIAGNOSIS — Z3009 Encounter for other general counseling and advice on contraception: Secondary | ICD-10-CM | POA: Diagnosis present

## 2018-05-03 DIAGNOSIS — O134 Gestational [pregnancy-induced] hypertension without significant proteinuria, complicating childbirth: Secondary | ICD-10-CM | POA: Diagnosis present

## 2018-05-03 DIAGNOSIS — O26899 Other specified pregnancy related conditions, unspecified trimester: Secondary | ICD-10-CM

## 2018-05-03 DIAGNOSIS — O99013 Anemia complicating pregnancy, third trimester: Secondary | ICD-10-CM | POA: Diagnosis present

## 2018-05-03 LAB — COMPREHENSIVE METABOLIC PANEL
ALBUMIN: 2.8 g/dL — AB (ref 3.5–5.0)
ALK PHOS: 172 U/L — AB (ref 38–126)
ALT: 13 U/L (ref 0–44)
ANION GAP: 9 (ref 5–15)
AST: 19 U/L (ref 15–41)
BILIRUBIN TOTAL: 0.5 mg/dL (ref 0.3–1.2)
BUN: 8 mg/dL (ref 6–20)
CALCIUM: 8.8 mg/dL — AB (ref 8.9–10.3)
CO2: 19 mmol/L — ABNORMAL LOW (ref 22–32)
Chloride: 108 mmol/L (ref 98–111)
Creatinine, Ser: 0.59 mg/dL (ref 0.44–1.00)
GFR calc non Af Amer: >60 mL/min (ref >60)
GLUCOSE: 108 mg/dL — AB (ref 70–99)
POTASSIUM: 4.2 mmol/L (ref 3.5–5.1)
Sodium: 136 mmol/L (ref 135–145)
TOTAL PROTEIN: 6.1 g/dL — AB (ref 6.5–8.1)

## 2018-05-03 LAB — URINALYSIS, ROUTINE W REFLEX MICROSCOPIC
BILIRUBIN URINE: NEGATIVE
Glucose, UA: NEGATIVE mg/dL
Ketones, ur: NEGATIVE mg/dL
Nitrite: NEGATIVE
PH: 7 (ref 5.0–8.0)
Protein, ur: NEGATIVE mg/dL
SPECIFIC GRAVITY, URINE: 1.01 (ref 1.005–1.030)

## 2018-05-03 LAB — CBC
HCT: 34.2 % — ABNORMAL LOW (ref 36.0–46.0)
Hemoglobin: 11.1 g/dL — ABNORMAL LOW (ref 12.0–15.0)
MCH: 29 pg (ref 26.0–34.0)
MCHC: 32.5 g/dL (ref 30.0–36.0)
MCV: 89.3 fL (ref 78.0–100.0)
PLATELETS: 585 10*3/uL — AB (ref 150–400)
RBC: 3.83 MIL/uL — ABNORMAL LOW (ref 3.87–5.11)
RDW: 15 % (ref 11.5–15.5)
WBC: 13.4 10*3/uL — ABNORMAL HIGH (ref 4.0–10.5)

## 2018-05-03 LAB — PROTEIN / CREATININE RATIO, URINE
Creatinine, Urine: 33 mg/dL
PROTEIN CREATININE RATIO: 0.21 mg/mg{creat} — AB (ref 0.00–0.15)
Total Protein, Urine: 7 mg/dL

## 2018-05-03 LAB — POCT FERN TEST: POCT FERN TEST: POSITIVE

## 2018-05-03 MED ORDER — OXYTOCIN 40 UNITS IN LACTATED RINGERS INFUSION - SIMPLE MED: 2.5000 [IU]/h | INTRAVENOUS | Status: DC

## 2018-05-03 MED ORDER — EPHEDRINE 5 MG/ML INJ
10.0000 mg | INTRAVENOUS | Status: DC | PRN
  Filled 2018-05-03: qty 2

## 2018-05-03 MED ORDER — ACETAMINOPHEN 325 MG PO TABS: 650.0000 mg | ORAL_TABLET | ORAL | Status: DC | PRN

## 2018-05-03 MED ORDER — FENTANYL CITRATE (PF) 100 MCG/2ML IJ SOLN
100.0000 ug | INTRAMUSCULAR | Status: DC | PRN
  Administered 2018-05-03 – 2018-05-04 (×2): 100 ug via INTRAVENOUS
  Filled 2018-05-03 (×3): qty 2

## 2018-05-03 MED ORDER — LIDOCAINE HCL (PF) 1 % IJ SOLN
30.0000 mL | INTRAMUSCULAR | Status: DC | PRN
  Filled 2018-05-03: qty 30

## 2018-05-03 MED ORDER — PENICILLIN G POT IN DEXTROSE 60000 UNIT/ML IV SOLN
3.0000 10*6.[IU] | INTRAVENOUS | Status: DC
  Administered 2018-05-03 – 2018-05-04 (×3): 3 10*6.[IU] via INTRAVENOUS
  Filled 2018-05-03 (×5): qty 50

## 2018-05-03 MED ORDER — TERBUTALINE SULFATE 1 MG/ML IJ SOLN
0.2500 mg | Freq: Once | INTRAMUSCULAR | Status: DC | PRN
  Filled 2018-05-03: qty 1

## 2018-05-03 MED ORDER — ONDANSETRON HCL 4 MG/2ML IJ SOLN
4.0000 mg | Freq: Four times a day (QID) | INTRAMUSCULAR | Status: DC | PRN
  Administered 2018-05-04: 4 mg via INTRAVENOUS
  Filled 2018-05-03: qty 2

## 2018-05-03 MED ORDER — FENTANYL 2.5 MCG/ML BUPIVACAINE 1/10 % EPIDURAL INFUSION (WH - ANES)
14.0000 mL/h | INTRAMUSCULAR | Status: DC | PRN
  Administered 2018-05-04 (×2): 14 mL/h via EPIDURAL
  Filled 2018-05-03 (×2): qty 100

## 2018-05-03 MED ORDER — LACTATED RINGERS IV SOLN
500.0000 mL | Freq: Once | INTRAVENOUS | Status: AC
  Administered 2018-05-04: 500 mL via INTRAVENOUS

## 2018-05-03 MED ORDER — SODIUM CHLORIDE 0.9 % IV SOLN
5.0000 10*6.[IU] | Freq: Once | INTRAVENOUS | Status: AC
  Administered 2018-05-03: 5 10*6.[IU] via INTRAVENOUS
  Filled 2018-05-03: qty 5

## 2018-05-03 MED ORDER — LACTATED RINGERS IV SOLN
500.0000 mL | INTRAVENOUS | Status: DC | PRN
  Administered 2018-05-03: 1000 mL via INTRAVENOUS
  Administered 2018-05-04: 500 mL via INTRAVENOUS

## 2018-05-03 MED ORDER — LACTATED RINGERS IV SOLN
INTRAVENOUS | Status: DC
  Administered 2018-05-03 – 2018-05-04 (×2): via INTRAVENOUS

## 2018-05-03 MED ORDER — SOD CITRATE-CITRIC ACID 500-334 MG/5ML PO SOLN: 30.0000 mL | ORAL | Status: DC | PRN

## 2018-05-03 MED ORDER — OXYTOCIN 40 UNITS IN LACTATED RINGERS INFUSION - SIMPLE MED
1.0000 m[IU]/min | INTRAVENOUS | Status: DC
  Administered 2018-05-03: 2 m[IU]/min via INTRAVENOUS
  Administered 2018-05-04: 14 m[IU]/min via INTRAVENOUS
  Filled 2018-05-03: qty 1000

## 2018-05-03 MED ORDER — PHENYLEPHRINE 40 MCG/ML (10ML) SYRINGE FOR IV PUSH (FOR BLOOD PRESSURE SUPPORT)
80.0000 ug | PREFILLED_SYRINGE | INTRAVENOUS | Status: DC | PRN
  Filled 2018-05-03: qty 5

## 2018-05-03 MED ORDER — DIPHENHYDRAMINE HCL 50 MG/ML IJ SOLN: 12.5000 mg | INTRAMUSCULAR | Status: DC | PRN

## 2018-05-03 MED ORDER — PHENYLEPHRINE 40 MCG/ML (10ML) SYRINGE FOR IV PUSH (FOR BLOOD PRESSURE SUPPORT)
80.0000 ug | PREFILLED_SYRINGE | INTRAVENOUS | Status: DC | PRN
  Filled 2018-05-03: qty 10
  Filled 2018-05-03: qty 5

## 2018-05-03 MED ORDER — OXYCODONE-ACETAMINOPHEN 5-325 MG PO TABS: 1.0000 | ORAL_TABLET | ORAL | Status: DC | PRN

## 2018-05-03 MED ORDER — OXYCODONE-ACETAMINOPHEN 5-325 MG PO TABS: 2.0000 | ORAL_TABLET | ORAL | Status: DC | PRN

## 2018-05-03 MED ORDER — OXYTOCIN BOLUS FROM INFUSION
500.0000 mL | Freq: Once | INTRAVENOUS | Status: AC
  Administered 2018-05-04: 500 mL via INTRAVENOUS

## 2018-05-03 NOTE — MAU Note (Addendum)
Krystal Garrett is a 27 y.o. at [redacted]w[redacted]d here in MAU reporting:  +LOF, yellow and green in color. Having to wear a pad. Onset of complaint: 7am Pain score: none Called the on call and they told her to come in Denies vaginal bleeding +decreased fetal movement. Reports feeling some movement just not as much as usual Vitals:   05/03/18 1348  BP: 130/69  Pulse: 89  Resp: 18  Temp: 97.7 F (36.5 C)  SpO2: 99%     FHT: 154 Lab orders placed from triage: ua and mau labor triage

## 2018-05-03 NOTE — H&P (Addendum)
Krystal Garrett is a 27 y.o. female G3P1011 @[redacted]w[redacted]d  presenting for leaking green/yellow fluid since 7 am.  She initially reported decreased fetal movement but reports normal movement on admission.  She denies regular contractions, vaginal bleeding, vaginal itching/burning, urinary symptoms, h/a, dizziness, n/v, or fever/chills.    . OB History    Gravida  3   Para  1   Term  1   Preterm  0   AB  1   Living  1     SAB  1   TAB  0   Ectopic  0   Multiple  0   Live Births  1          Past Medical History:  Diagnosis Date  . Anxiety   . Depression   . Gallstone   . Kidney stone   . OCD (obsessive compulsive disorder)    Past Surgical History:  Procedure Laterality Date  . TONSILLECTOMY AND ADENOIDECTOMY     as a child   Family History: family history includes Cancer in her maternal grandmother; Diabetes in her maternal grandmother and mother; Hypertension in her father, maternal grandmother, and mother. Social History:  reports that she has quit smoking. Her smoking use included cigarettes. She smoked 0.25 packs per day. She has never used smokeless tobacco. She reports that she does not drink alcohol or use drugs.     Maternal Diabetes: No Genetic Screening: Normal Maternal Ultrasounds/Referrals: Normal Fetal Ultrasounds or other Referrals:  None Maternal Substance Abuse:  No Significant Maternal Medications:  None Significant Maternal Lab Results:  Lab values include: Group B Strep positive Other Comments:  None  ROS Maternal Medical History:  Reason for admission: Rupture of membranes.   Contractions: Frequency: rare.   Perceived severity is mild.    Fetal activity: Perceived fetal activity is normal.   Last perceived fetal movement was within the past hour.    Prenatal complications: no prenatal complications Prenatal Complications - Diabetes: none.    Dilation: 3 Effacement (%): 50 Station: -3 Exam by:: n druebbisch rn Blood pressure 130/69,  pulse 89, temperature 97.7 F (36.5 C), temperature source Oral, resp. rate 18, weight 202 lb 0.6 oz (91.6 kg), last menstrual period 08/02/2017, SpO2 99 %. Maternal Exam:  Uterine Assessment: Contraction strength is mild.  Contraction frequency is rare.   Abdomen: Fetal presentation: vertex  Introitus: Ferning test: positive.  Amniotic fluid character: meconium stained.  Cervix: Cervix evaluated by digital exam.     Fetal Exam Fetal Monitor Review: Mode: ultrasound.   Baseline rate: 135.  Variability: moderate (6-25 bpm).   Pattern: accelerations present and no decelerations.    Fetal State Assessment: Category I - tracings are normal.     Physical Exam  Nursing note and vitals reviewed. Constitutional: She is oriented to person, place, and time. She appears well-developed and well-nourished.  Neck: Normal range of motion.  Cardiovascular: Normal rate.  Respiratory: Effort normal.  GI: Soft.  Musculoskeletal: Normal range of motion.  Neurological: She is alert and oriented to person, place, and time.  Skin: Skin is warm and dry.  Psychiatric: She has a normal mood and affect. Her behavior is normal. Judgment and thought content normal.    Prenatal labs: ABO, Rh: AB/Negative/-- (12/18 1335) Antibody: Negative (12/18 1335) Rubella: 16.50 (12/18 1335) RPR: Non Reactive (04/12 1003)  HBsAg: Negative (12/18 1335)  HIV: Non Reactive (04/12 1003)  GBS:   Positive  Assessment/Plan: G3P1011@[redacted]w[redacted]d  with PROM with meconium stained fluid GBS positive  Admit to [facility] Active management with Pitocin PCN for GBS prophylaxis Elevated BP x 2 with some normal BP on admission, preeclampsia labs ordered Anticipate NSVD   Plans BTL for contraception, papers signed 02/14/18  Fatima Blank 05/03/2018, 2:46 PM

## 2018-05-04 ENCOUNTER — Encounter (HOSPITAL_COMMUNITY): Payer: Self-pay | Admitting: Family Medicine

## 2018-05-04 ENCOUNTER — Inpatient Hospital Stay (HOSPITAL_COMMUNITY): Payer: Medicaid Other | Admitting: Anesthesiology

## 2018-05-04 ENCOUNTER — Inpatient Hospital Stay (HOSPITAL_COMMUNITY): Payer: Medicaid Other | Admitting: Registered Nurse

## 2018-05-04 ENCOUNTER — Encounter (HOSPITAL_COMMUNITY)
Admission: AD | Disposition: A | Discharge status: Home / Self Care | Payer: Self-pay | Source: Home / Self Care | Attending: Obstetrics and Gynecology

## 2018-05-04 DIAGNOSIS — O99824 Streptococcus B carrier state complicating childbirth: Secondary | ICD-10-CM

## 2018-05-04 DIAGNOSIS — Z3A39 39 weeks gestation of pregnancy: Secondary | ICD-10-CM

## 2018-05-04 LAB — RPR: RPR: NONREACTIVE

## 2018-05-04 SURGERY — LIGATION, FALLOPIAN TUBE, POSTPARTUM
Anesthesia: Epidural | Laterality: Bilateral

## 2018-05-04 MED ORDER — SODIUM CHLORIDE 0.9 % IV SOLN: 250.0000 mL | INTRAVENOUS | Status: DC | PRN

## 2018-05-04 MED ORDER — METOCLOPRAMIDE HCL 10 MG PO TABS
10.0000 mg | ORAL_TABLET | Freq: Once | ORAL | Status: AC
  Administered 2018-05-04: 10 mg via ORAL
  Filled 2018-05-04: qty 1

## 2018-05-04 MED ORDER — DIPHENHYDRAMINE HCL 25 MG PO CAPS: 25.0000 mg | ORAL_CAPSULE | Freq: Four times a day (QID) | ORAL | Status: DC | PRN

## 2018-05-04 MED ORDER — ACETAMINOPHEN 325 MG PO TABS
650.0000 mg | ORAL_TABLET | ORAL | Status: DC | PRN
  Filled 2018-05-04: qty 2

## 2018-05-04 MED ORDER — SIMETHICONE 80 MG PO CHEW: 80.0000 mg | CHEWABLE_TABLET | ORAL | Status: DC | PRN

## 2018-05-04 MED ORDER — ONDANSETRON HCL 4 MG PO TABS: 4.0000 mg | ORAL_TABLET | ORAL | Status: DC | PRN

## 2018-05-04 MED ORDER — LIDOCAINE HCL (PF) 1 % IJ SOLN
INTRAMUSCULAR | Status: DC | PRN
  Administered 2018-05-04 (×2): 5 mL via EPIDURAL

## 2018-05-04 MED ORDER — TETANUS-DIPHTH-ACELL PERTUSSIS 5-2.5-18.5 LF-MCG/0.5 IM SUSP: 0.5000 mL | Freq: Once | INTRAMUSCULAR | Status: DC

## 2018-05-04 MED ORDER — ONDANSETRON HCL 4 MG/2ML IJ SOLN: 4.0000 mg | INTRAMUSCULAR | Status: DC | PRN

## 2018-05-04 MED ORDER — FAMOTIDINE 20 MG PO TABS
40.0000 mg | ORAL_TABLET | Freq: Once | ORAL | Status: AC
  Administered 2018-05-04: 40 mg via ORAL
  Filled 2018-05-04: qty 2

## 2018-05-04 MED ORDER — BENZOCAINE-MENTHOL 20-0.5 % EX AERO: 1.0000 "application " | INHALATION_SPRAY | CUTANEOUS | Status: DC | PRN

## 2018-05-04 MED ORDER — SENNOSIDES-DOCUSATE SODIUM 8.6-50 MG PO TABS
2.0000 | ORAL_TABLET | ORAL | Status: DC
  Filled 2018-05-04: qty 2

## 2018-05-04 MED ORDER — WITCH HAZEL-GLYCERIN EX PADS
1.0000 "application " | MEDICATED_PAD | CUTANEOUS | Status: DC | PRN
  Administered 2018-05-04: 1 via TOPICAL

## 2018-05-04 MED ORDER — MEASLES, MUMPS & RUBELLA VAC ~~LOC~~ INJ: 0.5000 mL | INJECTION | Freq: Once | SUBCUTANEOUS | Status: DC

## 2018-05-04 MED ORDER — IBUPROFEN 600 MG PO TABS
600.0000 mg | ORAL_TABLET | Freq: Four times a day (QID) | ORAL | Status: DC
  Filled 2018-05-04: qty 1

## 2018-05-04 MED ORDER — NAPROXEN 500 MG PO TABS
500.0000 mg | ORAL_TABLET | Freq: Two times a day (BID) | ORAL | Status: DC
  Administered 2018-05-04 – 2018-05-05 (×2): 500 mg via ORAL
  Filled 2018-05-04 (×2): qty 1

## 2018-05-04 MED ORDER — SODIUM CHLORIDE 0.9% FLUSH: 3.0000 mL | INTRAVENOUS | Status: DC | PRN

## 2018-05-04 MED ORDER — PRENATAL MULTIVITAMIN CH: 1.0000 | ORAL_TABLET | Freq: Every day | ORAL | Status: DC

## 2018-05-04 MED ORDER — SODIUM CHLORIDE 0.9% FLUSH: 3.0000 mL | Freq: Two times a day (BID) | INTRAVENOUS | Status: DC

## 2018-05-04 MED ORDER — ZOLPIDEM TARTRATE 5 MG PO TABS: 5.0000 mg | ORAL_TABLET | Freq: Every evening | ORAL | Status: DC | PRN

## 2018-05-04 MED ORDER — COCONUT OIL OIL: 1.0000 "application " | TOPICAL_OIL | Status: DC | PRN

## 2018-05-04 MED ORDER — DIBUCAINE 1 % RE OINT
1.0000 "application " | TOPICAL_OINTMENT | RECTAL | Status: DC | PRN
  Administered 2018-05-04: 1 via RECTAL
  Filled 2018-05-04: qty 28

## 2018-05-04 NOTE — Anesthesia Procedure Notes (Signed)
Epidural Patient location during procedure: OB Start time: 05/04/2018 2:32 AM End time: 05/04/2018 2:42 AM  Staffing Anesthesiologist: Murvin Natal, MD Performed: anesthesiologist   Preanesthetic Checklist Completed: patient identified, site marked, pre-op evaluation, timeout performed, IV checked, risks and benefits discussed and monitors and equipment checked  Epidural Patient position: sitting Prep: DuraPrep Patient monitoring: heart rate, cardiac monitor, continuous pulse ox and blood pressure Approach: midline Location: L4-L5 Injection technique: LOR air  Needle:  Needle type: Tuohy  Needle gauge: 17 G Needle length: 9 cm Needle insertion depth: 5 cm Catheter type: closed end flexible Catheter size: 19 Gauge Catheter at skin depth: 10 cm Test dose: negative and Other  Assessment Events: blood not aspirated, injection not painful, no injection resistance and negative IV test  Additional Notes Informed consent obtained prior to proceeding including risk of failure, 1% risk of PDPH, risk of minor discomfort and bruising.  Discussed alternatives to epidural analgesia and patient desires to proceed.  Timeout performed pre-procedure verifying patient name, procedure, and platelet count.  Patient tolerated procedure well. Reason for block:procedure for pain

## 2018-05-04 NOTE — Plan of Care (Signed)
  Problem: Elimination: Goal: Will not experience complications related to urinary retention Outcome: Completed/Met  Pt voiding post urinary catheter removal.  Pt encouraged to increase fluid intake and attempt to void every couple of hours.  Pt verbalized understanding.  Problem: Activity: Goal: Ability to tolerate increased activity will improve Outcome: Completed/Met  Pt ambulating in the hallways without difficulty.  Pt verbalizes that pain is controlled with medication.  Encouraged pt to continue to ambulate in the hallways.

## 2018-05-04 NOTE — Anesthesia Postprocedure Evaluation (Signed)
Anesthesia Post Note  Patient: [name]  Procedure(s) Performed: AN AD [place]     Patient location during evaluation: Mother Baby Anesthesia Type: Epidural Level of consciousness: awake and alert and oriented Pain management: satisfactory to patient Vital Signs Assessment: post-procedure vital signs reviewed and stable Respiratory status: respiratory function stable Cardiovascular status: stable Postop Assessment: no headache, no backache, epidural receding, patient able to bend at knees, no signs of nausea or vomiting and adequate PO intake Anesthetic complications: no Comments: The patient states that her tubal sterilization procedure has been postponed and will be rescheduled in a couple of weeks.  She is eating lunch at this writing and desires the removal of her epidural catheter. The catheter was removed, tip intact.      Last Vitals:  Vitals:   05/04/18 1035 05/04/18 1135  BP: 130/84 138/77  Pulse: 62 70  Resp: 16 16  Temp: 36.6 C 36.6 C  SpO2: 99% 100%    Last Pain:  Vitals:   05/04/18 1135  TempSrc: Oral  PainSc: 7    Pain Goal: Patients Stated Pain Goal: 4 (05/04/18 1135)               Betzy Barbier

## 2018-05-04 NOTE — Progress Notes (Signed)
Patient desires permanent sterilization.  Other reversible forms of contraception were discussed with patient; she declines all other modalities. Risks of procedure discussed with patient including but not limited to: risk of regret, permanence of method, bleeding, infection, injury to surrounding organs and need for additional procedures.  Failure risk of 1-2 % with increased risk of ectopic gestation if pregnancy occurs was also discussed with patient.  Patient verbalized understanding of these risks and wants to proceed with sterilization.  Written informed consent obtained.  To OR when ready.  

## 2018-05-04 NOTE — Anesthesia Postprocedure Evaluation (Signed)
Anesthesia Post Note  Patient: Krystal Garrett  Procedure(s) Performed: AN AD HOC LABOR EPIDURAL     Anesthesia Post Evaluation  Last Vitals:  Vitals:   05/04/18 1035 05/04/18 1135  BP: 130/84 138/77  Pulse: 62 70  Resp: 16 16  Temp: 36.6 C 36.6 C  SpO2: 99% 100%    Last Pain:  Vitals:   05/04/18 1135  TempSrc: Oral  PainSc: 7    Pain Goal: Patients Stated Pain Goal: 4 (05/04/18 1135)               Aubryanna Nesheim

## 2018-05-04 NOTE — Anesthesia Preprocedure Evaluation (Signed)
Anesthesia Evaluation  Patient identified by MRN, date of birth, ID band Patient awake    Reviewed: Allergy & Precautions, H&P , NPO status , Patient's Chart, lab work & pertinent test results  History of Anesthesia Complications Negative for: history of anesthetic complications  Airway Mallampati: II  TM Distance: >3 FB Neck ROM: full    Dental no notable dental hx. (+) Teeth Intact   Pulmonary neg pulmonary ROS, former smoker,    Pulmonary exam normal breath sounds clear to auscultation       Cardiovascular negative cardio ROS Normal cardiovascular exam Rhythm:regular Rate:Normal     Neuro/Psych  Headaches, PSYCHIATRIC DISORDERS Anxiety Depression OCD (obsessive compulsive disorder)   GI/Hepatic negative GI ROS, Neg liver ROS,   Endo/Other  negative endocrine ROS  Renal/GU negative Renal ROS  negative genitourinary   Musculoskeletal   Abdominal (+) + obese,   Peds  Hematology  (+) anemia ,   Anesthesia Other Findings   Reproductive/Obstetrics (+) Pregnancy                             Anesthesia Physical Anesthesia Plan  ASA: II  Anesthesia Plan: Epidural   Post-op Pain Management:    Induction:   PONV Risk Score and Plan:   Airway Management Planned:   Additional Equipment:   Intra-op Plan:   Post-operative Plan:   Informed Consent: I have reviewed the patients History and Physical, chart, labs and discussed the procedure including the risks, benefits and alternatives for the proposed anesthesia with the patient or authorized representative who has indicated his/her understanding and acceptance.     Plan Discussed with:   Anesthesia Plan Comments:         Anesthesia Quick Evaluation

## 2018-05-04 NOTE — Progress Notes (Signed)
LABOR PROGRESS NOTE  Krystal Garrett is a 27 y.o. G3P1011 at [redacted]w[redacted]d  admitted for IOL for PROM  Subjective: Pt reports contractions are not getting stronger. Would like epidural  Objective: . Patient Vitals for the past 6 hrs:  BP Temp Temp src Pulse Resp  05/04/18 0157 (!) 143/78 - - 73 18  05/04/18 0131 123/75 - - 79 18  05/04/18 0126 129/72 - - 74 18  05/04/18 0047 131/89 - - 98 18  05/04/18 0037 136/80 - - 81 18  05/04/18 0034 - 97.8 F (36.6 C) Oral - -  05/04/18 0026 119/77 - - 77 18  05/03/18 2333 136/88 - - 76 18  05/03/18 2304 127/78 - - 69 18  05/03/18 2234 (!) 136/92 97.8 F (36.6 C) Oral 84 18  05/03/18 2203 133/88 - - 89 18  05/03/18 2132 129/84 - - 84 18  05/03/18 2101 137/82 - - 84 18  05/03/18 2053 - 97.7 F (36.5 C) Oral - -  05/03/18 2031 (!) 141/89 - - 90 18    Last SVE Dilation: 4 Effacement (%): 70 Cervical Position: Middle Station: -1 Presentation: Vertex Exam by:: Dyke Brackett, RN  FHT: baseline rate 130, moderate varibility, +acel, no decel Toco: ctx q 2-5 min  Assessment / Plan: 27 y.o. G3P1011 at [redacted]w[redacted]d here for IOL for PROM  Labor: Continue IV Pitocin Fetal Wellbeing:  Cat I Pain Control:  Will get epidural Anticipated MOD:  SVD  GHTN: intrapartum dx. Asymptomatic. UPC 0.21, HELLP labs wnl  Gailen Shelter, MD 05/04/2018

## 2018-05-04 NOTE — Plan of Care (Signed)
  Problem: Education: Goal: Knowledge of condition will improve Outcome: Progressing  Admission education completed.  Fall safety, baby safety, and plan of care reviewed.  Pt verbalized understanding of all teaching.

## 2018-05-04 NOTE — Progress Notes (Signed)
Dr. Rip Harbour notified and aware that 30 day tubal papers have not been located. Dr. Rip Harbour is okay with proceeding with patient's tubal. Documentation in the computer states that she signed them on 02/14/18.

## 2018-05-05 ENCOUNTER — Encounter (HOSPITAL_COMMUNITY): Payer: Self-pay

## 2018-05-05 ENCOUNTER — Encounter: Payer: Self-pay | Admitting: Advanced Practice Midwife

## 2018-05-05 MED ORDER — TETANUS-DIPHTH-ACELL PERTUSSIS 5-2.5-18.5 LF-MCG/0.5 IM SUSP: 0.5000 mL | Freq: Once | INTRAMUSCULAR | 0 refills | Status: AC

## 2018-05-05 MED ORDER — MEASLES, MUMPS & RUBELLA VAC ~~LOC~~ INJ: 0.5000 mL | INJECTION | Freq: Once | SUBCUTANEOUS | 0 refills | Status: AC

## 2018-05-05 MED ORDER — BENZOCAINE-MENTHOL 20-0.5 % EX AERO: 1.0000 "application " | INHALATION_SPRAY | CUTANEOUS | 1 refills | Status: DC | PRN

## 2018-05-05 MED ORDER — WITCH HAZEL-GLYCERIN EX PADS: 1.0000 "application " | MEDICATED_PAD | CUTANEOUS | 12 refills | Status: DC | PRN

## 2018-05-05 MED ORDER — NAPROXEN 500 MG PO TABS: 500.0000 mg | ORAL_TABLET | Freq: Two times a day (BID) | ORAL | 1 refills | Status: DC

## 2018-05-05 MED ORDER — ACETAMINOPHEN 325 MG PO TABS: 650.0000 mg | ORAL_TABLET | ORAL | 1 refills | Status: DC | PRN

## 2018-05-05 NOTE — Discharge Summary (Signed)
OB Discharge Summary     Patient Name: Krystal Garrett DOB: 1991/02/13 MRN: [id number]  Date of admission: 05/03/2018 Delivering MD: Koren Shiver D   Date of discharge: 05/05/2018  Admitting diagnosis: 39 WKS, LEAKING, PAIN Intrauterine pregnancy: [redacted]w[redacted]d    Secondary diagnosis:  Active Problems:   Rh negative state in antepartum period   Group B streptococcal bacteriuria   Anemia affecting pregnancy in third trimester   Unwanted fertility   PROM (premature rupture of membranes)  Additional problems:      Discharge diagnosis: Term Pregnancy Delivered                                                                                                Post partum procedures:desires PP tubal ligation but papers were lost. Will send message to JJordan   Augmentation: AROM and Pitocin  Complications: None  Hospital course:  Onset of Labor With Vaginal Delivery     27y.o. yo [id number] admitted in Active Labor on 05/03/2018. Patient had an uncomplicated labor course as follows:  Membrane Rupture Time/Date: 7:00 AM ,05/03/2018   Intrapartum Procedures: Episiotomy: None [1]                                         Lacerations:  None [1]  Patient had a delivery of a Viable infant. 05/04/2018  Information for the patient's newborn:  KeJyrah, Blye0[[id number]]Delivery Method: Vaginal, Spontaneous(Filed from Delivery Summary)    Pateint had an uncomplicated postpartum course.  She is ambulating, tolerating a regular diet, passing flatus, and urinating well. Patient is discharged home in stable condition on 05/05/18.   Physical exam  Vitals:   05/04/18 1135 05/04/18 1530 05/04/18 1925 05/05/18 0520  BP: 138/77 136/83 131/85 122/87  Pulse: 70 76 83 72  Resp: _0 Temp: 97.9 F (36.6 C) 98.2 F (36.8 C) 97.7 F (36.5 C) 98 F (36.7 C)  TempSrc: Oral Oral Oral Oral  SpO2: 100% 100% 99% 100%  Weight:      Height:       General: alert, cooperative and no  distress Lochia: appropriate Uterine Fundus: firm Incision: N/A DVT Evaluation: No evidence of DVT seen on physical exam. Labs: Lab Results  Component Value Date   WBC 13.4 (H) 05/03/2018   HGB 11.1 (L) 05/03/2018   HCT 34.2 (L) 05/03/2018   MCV 89.3 05/03/2018   PLT 585 (H) 05/03/2018   CMP Latest Ref Rng & Units 05/03/2018  Glucose 70 - 99 mg/dL 108(H)  BUN 6 - 20 mg/dL 8  Creatinine 0.44 - 1.00 mg/dL 0.59  Sodium 135 - 145 mmol/L 136  Potassium 3.5 - 5.1 mmol/L 4.2  Chloride 98 - 111 mmol/L 108  CO2 22 - 32 mmol/L 19(L)  Calcium 8.9 - 10.3 mg/dL 8.8(L)  Total Protein 6.5 - 8.1 g/dL 6.1(L)  Total Bilirubin 0.3 - 1.2 mg/dL 0.5  Alkaline Phos 38 - 126 U/L 172(H)  AST 15 -  41 U/L 19  ALT 0 - 44 U/L 13    Discharge instruction: per After Visit Summary and "Baby and Me Booklet".  After visit meds:  Allergies as of 05/05/2018   No Known Allergies     Medication List    STOP taking these medications   ferrous sulfate 325 (65 FE) MG tablet   guaiFENesin 600 MG 12 hr tablet Commonly known as:  MUCINEX   promethazine 25 MG tablet Commonly known as:  PHENERGAN     TAKE these medications   acetaminophen 325 MG tablet Commonly known as:  TYLENOL Take 2 tablets (650 mg total) by mouth every 4 (four) hours as needed (for pain scale < 4).   benzocaine-Menthol 20-0.5 % Aero Commonly known as:  DERMOPLAST Apply 1 application topically as needed for irritation (perineal discomfort).   calcium carbonate 500 MG chewable tablet Commonly known as:  TUMS - dosed in mg elemental calcium Chew 2 tablets by mouth daily as needed for indigestion or heartburn.   measles, mumps and rubella vaccine injection Commonly known as:  MMR Inject 0.5 mLs into the skin once for 1 dose.   naproxen 500 MG tablet Commonly known as:  NAPROSYN Take 1 tablet (500 mg total) by mouth 2 (two) times daily with a meal.   naproxen sodium 220 MG tablet Commonly known as:  ALEVE Take 220 mg by mouth  daily as needed (pain).   pantoprazole 40 MG tablet Commonly known as:  PROTONIX TAKE 1 TABLET BY MOUTH ONCE DAILY   prenatal multivitamin Tabs tablet Take 2 tablets daily at 12 noon by mouth.   Tdap 5-2.5-18.5 LF-MCG/0.5 injection Commonly known as:  BOOSTRIX Inject 0.5 mLs into the muscle once for 1 dose.   witch hazel-glycerin pad Commonly known as:  TUCKS Apply 1 application topically as needed for hemorrhoids.       Diet: routine diet  Activity: Advance as tolerated. Pelvic rest for 6 weeks.   Outpatient follow up:4 weeks Follow up Appt: Future Appointments  Date Time Provider [place]  05/05/2018  2:35 PM Tresea Mall, CNM [facility]   Follow up Visit:No follow-ups on file.  Postpartum contraception: Tubal Ligation. Will send message to [facility] for PP BTL interval.   Newborn Data: Live born female  Birth Weight: 8 lb 14.5 oz (4040 g) APGAR: 8, 9  Newborn Delivery   Birth date/time:  05/04/2018 08:39:00 Delivery type:  Vaginal, Spontaneous     Baby Feeding: Bottle and Breast Disposition:home with mother   05/05/2018 Starr Lake, CNM

## 2018-05-05 NOTE — Progress Notes (Signed)
CSW received consult for hx of Anxiety and Depression.  CSW met with MOB to offer support and complete assessment.  MGM, MOB's step-father and MOB's daughter were present in the room and MOB gave CSW permission to speak openly in front of them. MOB reports that her pregnancy, labor and delivery all went very well.  She states "nothing weird" happened and that "I had no complications."  In regards to emotions, she stated that she was a little more emotional than she is typically, but that she felt this was normal in pregnancy, with no major concerns.  She states she met with Jamie[facility] at [facility] and found this helpful, as "Jamie has resources for a lot of stuff."  She states plans to contact Jamie if concerns arise at any time postpartum.  She reports no questions, concerns or needs at this time. CSW provided education regarding the baby blues period vs. perinatal mood disorders, and recommends self-evaluation during the postpartum time period using the Edinburgh Postnatal Depression Scale.  MOB reports that she scored a 1 on the screen during this hospitalization.  CSW encouraged MOB to contact a medical professional if symptoms are noted at any time.   CSW provided review of Sudden Infant Death Syndrome (SIDS) precautions.  MOB reports that she has a good support system (MGM and step-father live next door) and everything she needs for baby at home. CSW identifies no further need for intervention and no barriers to discharge at this time.  

## 2018-05-06 LAB — TYPE AND SCREEN
ABO/RH(D): AB NEG
Antibody Screen: POSITIVE
UNIT DIVISION: 0
UNIT DIVISION: 0
Unit division: 0
Unit division: 0

## 2018-05-06 LAB — BPAM RBC
BLOOD PRODUCT EXPIRATION DATE: 201907212359
BLOOD PRODUCT EXPIRATION DATE: 201907232359
Blood Product Expiration Date: 201907202359
Blood Product Expiration Date: 201907252359
UNIT TYPE AND RH: 600
UNIT TYPE AND RH: 9500
UNIT TYPE AND RH: 9500
Unit Type and Rh: 600

## 2018-05-20 ENCOUNTER — Encounter (HOSPITAL_COMMUNITY): Payer: Self-pay

## 2018-06-02 ENCOUNTER — Encounter: Payer: Self-pay | Admitting: Obstetrics & Gynecology

## 2018-06-02 ENCOUNTER — Ambulatory Visit: Payer: Self-pay | Admitting: Family Medicine

## 2018-06-02 ENCOUNTER — Ambulatory Visit (INDEPENDENT_AMBULATORY_CARE_PROVIDER_SITE_OTHER): Payer: Medicaid Other | Admitting: Obstetrics & Gynecology

## 2018-06-02 ENCOUNTER — Other Ambulatory Visit (HOSPITAL_COMMUNITY)
Admission: RE | Admit: 2018-06-02 | Discharge: 2018-06-02 | Disposition: A | Discharge status: Home / Self Care | Payer: Medicaid Other | Source: Ambulatory Visit | Attending: Obstetrics and Gynecology | Admitting: Obstetrics and Gynecology

## 2018-06-02 VITALS — BP 135/88 | HR 64 | Ht 66.0 in | Wt 176.0 lb

## 2018-06-02 DIAGNOSIS — Z87898 Personal history of other specified conditions: Secondary | ICD-10-CM | POA: Diagnosis present

## 2018-06-02 DIAGNOSIS — Z1389 Encounter for screening for other disorder: Secondary | ICD-10-CM

## 2018-06-02 DIAGNOSIS — Z8742 Personal history of other diseases of the female genital tract: Secondary | ICD-10-CM

## 2018-06-02 NOTE — Progress Notes (Signed)
Subjective:     Krystal Garrett is a 27 y.o. female who presents for a postpartum visit. She is 4 weeks postpartum following a spontaneous vaginal delivery. I have fully reviewed the prenatal and intrapartum course. The delivery was at 67 gestational weeks. Outcome: spontaneous vaginal delivery. Anesthesia: epidural. Postpartum course has been unremarkable. Baby's course has been Unremarkable. Baby is feeding by bottle - Gerber gentle. Bleeding no bleeding. Bowel function is normal. Bladder function is normal. Patient is not sexually active. Contraception method is abstinence. Postpartum depression screening: negative.  The following portions of the patient's history were reviewed and updated as appropriate: allergies, current medications, past family history, past medical history, past social history, past surgical history and problem list.  Review of Systems Pertinent items are noted in HPI.   Objective:  BP 135/88   Pulse 64   Ht 5\' 6"  (1.676 m)   Wt 176 lb (79.8 kg)   BMI 28.41 kg/m   CONSTITUTIONAL: Well-developed, well-nourished female in no acute distress.  HENT:  Normocephalic, atraumatic EYES: Conjunctivae and EOM are normal. No scleral icterus.  NECK: Normal range of motion SKIN: Skin is warm and dry. No rash noted. Not diaphoretic.No pallor. [place]: Alert and oriented to person, place, and time. Normal coordination.  GU: EGBUS: no lesions Vagina: no blood in vault Cervix: no lesion; no mucopurulent d/c        Assessment:    4 weeks postpartum exam. Pap smear done at today's visit.   Plan:    1. Contraception: abstinence 2. For BTL with Dr. Kennon Rounds 07/23/2018 3. Follow up in: 1 year  or as needed.   4. Reviewed abnormal PAP. If cont'd abnormal will perform colpo.  Angline Schweigert L. Harraway-Smith, M.D., Cherlynn June

## 2018-06-04 ENCOUNTER — Ambulatory Visit: Payer: Self-pay | Admitting: Obstetrics & Gynecology

## 2018-06-05 LAB — CYTOLOGY - PAP

## 2018-06-11 ENCOUNTER — Inpatient Hospital Stay (HOSPITAL_COMMUNITY): Admission: RE | Admit: 2018-06-11 | Payer: Self-pay | Source: Ambulatory Visit

## 2018-06-18 ENCOUNTER — Encounter: Payer: Self-pay | Admitting: *Deleted

## 2018-07-14 NOTE — H&P (Signed)
Krystal Garrett is an 27 y.o. [phone] female.   Chief Complaint: Undesired fertility HPI: Here for permanent sterilization. S/p SVD of baby # 2 in 05/2018.  Past Medical History:  Diagnosis Date  . Anxiety   . Depression   . Gallstone   . Kidney stone   . OCD (obsessive compulsive disorder)     Past Surgical History:  Procedure Laterality Date  . TONSILLECTOMY AND ADENOIDECTOMY     as a child    Family History  Problem Relation Age of Onset  . Hypertension Mother   . Diabetes Mother   . Hypertension Father   . Cancer Maternal Grandmother   . Diabetes Maternal Grandmother   . Hypertension Maternal Grandmother    Social History:  reports that she has quit smoking. Her smoking use included cigarettes. She smoked 0.25 packs per day. She has never used smokeless tobacco. She reports that she does not drink alcohol or use drugs.  Allergies: No Known Allergies  No medications prior to admission.    A comprehensive review of systems was negative.  unknown if currently breastfeeding. General appearance: alert, cooperative and appears stated age Head: Normocephalic, without obvious abnormality, atraumatic Neck: supple, symmetrical, trachea midline Lungs: normal effort Heart: regular rate and rhythm Abdomen: soft, non-tender; bowel sounds normal; no masses,  no organomegaly Extremities: extremities normal, atraumatic, no cyanosis or edema Skin: Skin color, texture, turgor normal. No rashes or lesions Neurologic: Grossly normal   Assessment/Plan Principal Problem:   Unwanted fertility  For laparoscopic BTL  Risks include but are not limited to bleeding, infection, injury to surrounding structures, including bowel, bladder and ureters, blood clots, and death.  Likelihood of success is high.    Donnamae Jude 07/14/2018, 9:48 AM

## 2018-07-23 ENCOUNTER — Ambulatory Visit (HOSPITAL_BASED_OUTPATIENT_CLINIC_OR_DEPARTMENT_OTHER): Admission: RE | Admit: 2018-07-23 | Payer: Medicaid Other | Source: Ambulatory Visit | Admitting: Family Medicine

## 2018-07-23 SURGERY — LIGATION, FALLOPIAN TUBE, LAPAROSCOPIC
Anesthesia: Choice | Laterality: Bilateral

## 2018-08-05 ENCOUNTER — Other Ambulatory Visit: Payer: Self-pay

## 2018-08-05 DIAGNOSIS — O26899 Other specified pregnancy related conditions, unspecified trimester: Secondary | ICD-10-CM

## 2018-08-05 DIAGNOSIS — R12 Heartburn: Secondary | ICD-10-CM

## 2018-08-05 DIAGNOSIS — Z3480 Encounter for supervision of other normal pregnancy, unspecified trimester: Secondary | ICD-10-CM

## 2018-08-25 ENCOUNTER — Other Ambulatory Visit (HOSPITAL_COMMUNITY)
Admission: RE | Admit: 2018-08-25 | Discharge: 2018-08-25 | Disposition: A | Discharge status: Home / Self Care | Payer: Medicaid Other | Source: Ambulatory Visit | Attending: Obstetrics & Gynecology | Admitting: Obstetrics & Gynecology

## 2018-08-25 ENCOUNTER — Encounter: Payer: Self-pay | Admitting: Obstetrics & Gynecology

## 2018-08-25 ENCOUNTER — Ambulatory Visit (INDEPENDENT_AMBULATORY_CARE_PROVIDER_SITE_OTHER): Payer: Medicaid Other | Admitting: Obstetrics & Gynecology

## 2018-08-25 VITALS — BP 105/65 | HR 66 | Ht 66.0 in | Wt 171.0 lb

## 2018-08-25 DIAGNOSIS — R87612 Low grade squamous intraepithelial lesion on cytologic smear of cervix (LGSIL): Secondary | ICD-10-CM | POA: Insufficient documentation

## 2018-08-25 DIAGNOSIS — Z3202 Encounter for pregnancy test, result negative: Secondary | ICD-10-CM

## 2018-08-25 DIAGNOSIS — N879 Dysplasia of cervix uteri, unspecified: Secondary | ICD-10-CM

## 2018-08-25 NOTE — Patient Instructions (Signed)
Loop Electrosurgical Excision Procedure Loop electrosurgical excision procedure (LEEP) is the cutting and removal (excision) of part of the cervix. The cervix is the bottom part of the uterus that opens into the vagina. The tissue that is removed from the cervix is then examined to see if there are precancerous cells or cancer cells present. LEEP may be done when:  You have abnormal bleeding from your cervix.  You have an abnormal Pap test result.  Your health care provider finds an abnormality on your cervix during a pelvic exam.  LEEP typically only takes a few minutes and is often done in the health care provider's office. The procedure is safe for women who are trying to get pregnant. However, the procedure is usually not done during a menstrual period or during pregnancy. Tell a health care provider about:  Any allergies you have.  All medicines you are taking, including vitamins, herbs, eye drops, creams, and over-the-counter medicines.  Any problems you or family members have had with anesthetic medicines.  Any blood disorders you have.  Any surgeries you have had.  Any medical conditions you have, including current or past vaginal infections, such as herpes or sexually transmitted infections (STIs).  Whether you are pregnant or may be pregnant. What are the risks? Generally, this is a safe procedure. However, problems may occur, including:  Infection.  Bleeding.  Allergic reactions to medicines.  Changes or scarring in the cervix.  Increased risk of early (preterm) labor in future pregnancies.  What happens before the procedure?  Ask your health care provider about: ? Changing or stopping your regular medicines. This is especially important if you are taking diabetes medicines or blood thinners. ? Taking medicines such as aspirin and ibuprofen. These medicines can thin your blood. Do not take these medicines before your procedure if your health care provider  instructs you not to.  Your health care provider may recommend that you take pain medicine before the procedure.  Plan to have someone take you home after the procedure. What happens during the procedure?  An instrument called a speculum will be placed in your vagina. This will allow your health care provider to see the cervix.  You will be given a medicine to numb the area (local anesthetic). The medicine will be injected into your cervix and the surrounding area.  A solution will be applied to your cervix. This solution will help the health care provider find the abnormal cells that need to be removed.  A thin wire loop will be passed through your vagina. The wire will be used to burn (cauterize) the cervical tissue with an electrical current.  The abnormal cervical tissue will be removed.  Any open blood vessels will be cauterized to prevent bleeding.  A paste may be applied to the cauterized area of your cervix to help prevent bleeding.  The sample of cervical tissue will be examined under a microscope. The procedure may vary among health care providers and hospitals. What happens after the procedure?  You may have mild abdominal cramping.  You may have a small amount of bleeding (spotting) from the vagina.  You may have a dark-colored discharge coming from your vagina. This is from the paste that was used on the cervix to prevent bleeding.  Your health care provider may recommend pelvic rest. Pelvic rest generally means avoiding sex and not putting anything in the vagina, such as tampons, creams, and douches.  It is your responsibility to get your test results. Ask your  health care provider or the department performing the test when your results will be ready. This information is not intended to replace advice given to you by your health care provider. Make sure you discuss any questions you have with your health care provider. Document Released: 01/12/2003 Document Revised:  11/18/2015 Document Reviewed: 09/05/2015 Elsevier Interactive Patient Education  2018 [place] After This sheet gives you information about how to care for yourself after your procedure. Your health care provider may also give you more specific instructions. If you have problems or questions, contact your health care provider. What can I expect after the procedure? If you had a colposcopy without a biopsy, you can expect to feel fine right away, but you may have some spotting for a few days. You can go back to your normal activities. If you had a colposcopy with a biopsy, it is common to have:  Soreness and pain. This may last for a few days.  Light-headedness.  Mild vaginal bleeding or dark-colored, grainy discharge. This may last for a few days. The discharge may be due to a solution that was used during the procedure. You may need to wear a sanitary pad during this time.  Spotting for at least 48 hours after the procedure.  Follow these instructions at home:  Take over-the-counter and prescription medicines only as told by your health care provider. Talk with your health care provider about what type of over-the-counter pain medicine and prescription medicine you can start taking again. It is especially important to talk with your health care provider if you take blood-thinning medicine.  Do not drive or use heavy machinery while taking prescription pain medicine.  For at least 3 days after your procedure, or as long as told by your health care provider, avoid: ? Douching. ? Using tampons. ? Having sexual intercourse.  Continue to use birth control (contraception).  Limit your physical activity for the first day after the procedure as told by your health care provider. Ask your health care provider what activities are safe for you.  It is up to you to get the results of your procedure. Ask your health care provider, or the department performing the procedure,  when your results will be ready.  Keep all follow-up visits as told by your health care provider. This is important. Contact a health care provider if:  You develop a skin rash. Get help right away if:  You are bleeding heavily from your vagina or you are passing blood clots. This includes using more than one sanitary pad per hour for 2 hours in a row.  You have a fever or chills.  You have pelvic pain.  You have abnormal, yellow-colored, or bad-smelling vaginal discharge. This could be a sign of infection.  You have severe pain or cramps in your lower abdomen that do not get better with medicine.  You feel light-headed or dizzy, or you faint. Summary  If you had a colposcopy without a biopsy, you can expect to feel fine right away, but you may have some spotting for a few days. You can go back to your normal activities.  If you had a colposcopy with a biopsy, you may notice mild pain and spotting for 48 hours after the procedure.  Avoid douching, using tampons, and having sexual intercourse for 3 days after the procedure or as long as told by your health care provider.  Contact your health care provider if you have bleeding, severe pain, or signs of  infection. This information is not intended to replace advice given to you by your health care provider. Make sure you discuss any questions you have with your health care provider. Document Released: 08/12/2013 Document Revised: 06/08/2016 Document Reviewed: 06/08/2016 Elsevier Interactive Patient Education  2018 [employer].

## 2018-08-25 NOTE — Progress Notes (Signed)
Patient given informed consent, signed copy in the chart, time out was performed.  Placed in lithotomy position. Cervix viewed with speculum and colposcope after application of acetic acid.  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 CLINICAL CORRELATION IS RECOMMENDED.   Material Submitted CervicoVaginal Pap [ThinPrep Imaged]Abnormal     Colposcopy adequate?  yes Acetowhite lesions? yes Punctation? yes Mosaicism?  no Abnormal vasculature?  no Biopsies? yes ECC? yes   Patient was given post procedure instructions.  I suspect a high grade lesion She will return in 2 weeks for results. I suspect that she will need a LEEP. I have asked her to view the LEEP video.    Markham Dumlao L. Harraway-Smith, M.D., Cherlynn June

## 2018-08-26 ENCOUNTER — Encounter: Payer: Self-pay | Admitting: *Deleted

## 2018-08-26 LAB — POCT PREGNANCY, URINE: Preg Test, Ur: NEGATIVE

## 2018-09-02 ENCOUNTER — Telehealth: Payer: Self-pay | Admitting: *Deleted

## 2018-09-02 NOTE — Telephone Encounter (Signed)
Called pt to inform her of her colposcopy results per Dr. Vladimir Creeks note and instructed pt to follow up for a pap in 1 year.  Pt verbalized understanding.

## 2018-09-02 NOTE — Telephone Encounter (Signed)
-----   Message from Lavonia Drafts, MD sent at 09/02/2018  9:54 AM EDT ----- Please call pt. Her colpo shows low grade dysplasia. No evidence of high grade lesion at present. She needs to f/u in 1 year for PAP.   Thx, clh-S

## 2018-09-04 ENCOUNTER — Encounter: Payer: Self-pay | Admitting: *Deleted

## 2018-11-13 IMAGING — US US OB COMP LESS 14 WK
1 series · 15 of 28 positions shown · non-contrast
Comparison: None.

CLINICAL DATA: 26-year-old pregnant female presenting with
abdominal cramping. LMP: 08/02/2017 corresponding to an estimated
gestational age of 7 weeks, 0 days.

EXAM:
OBSTETRIC <14 WK US AND TRANSVAGINAL OB US
TECHNIQUE: Both transabdominal and transvaginal ultrasound examinations were
performed for complete evaluation of the gestation as well as the
maternal uterus, adnexal regions, and pelvic cul-de-sac.
Transvaginal technique was performed to assess early pregnancy.

[Series 1: us ob comp less 14 wk · 15 of 50 slices shown]
[im 1/50]
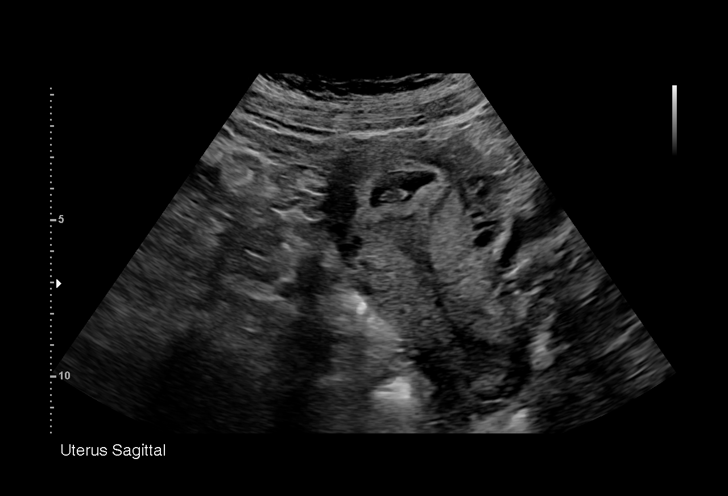
[im 4/50]
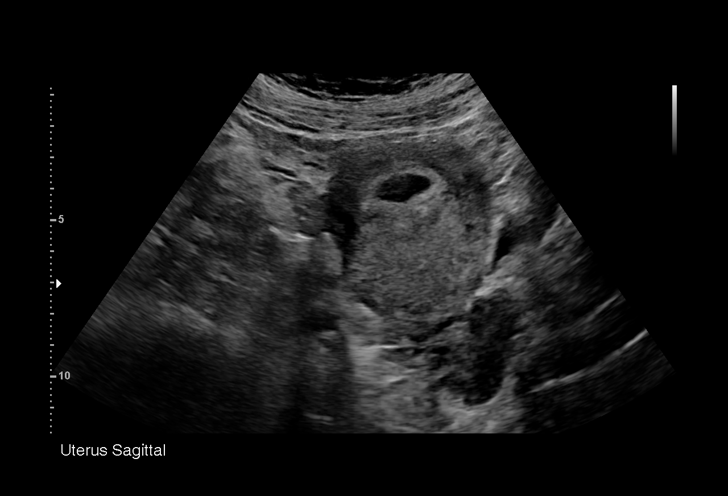
[im 8/50]
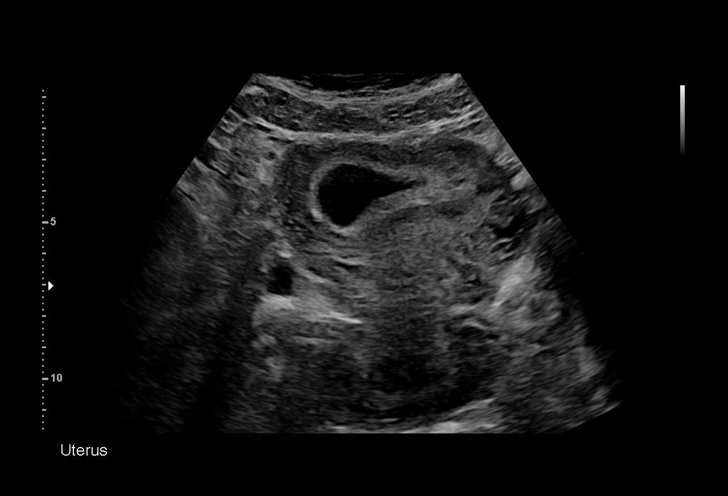
[im 11/50]
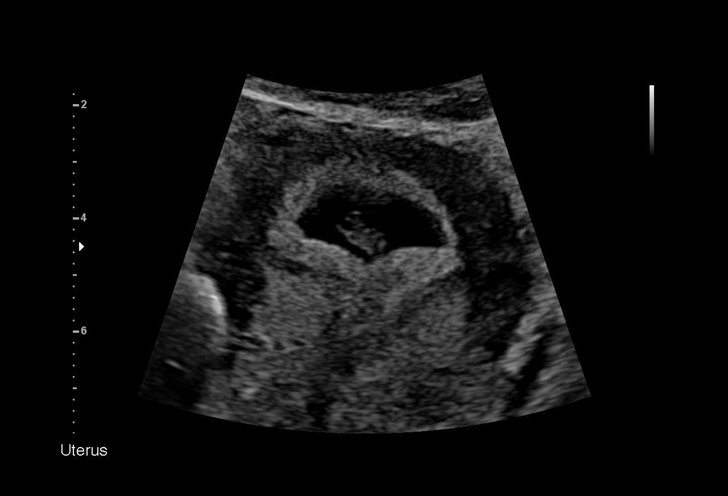
[im 15/50]
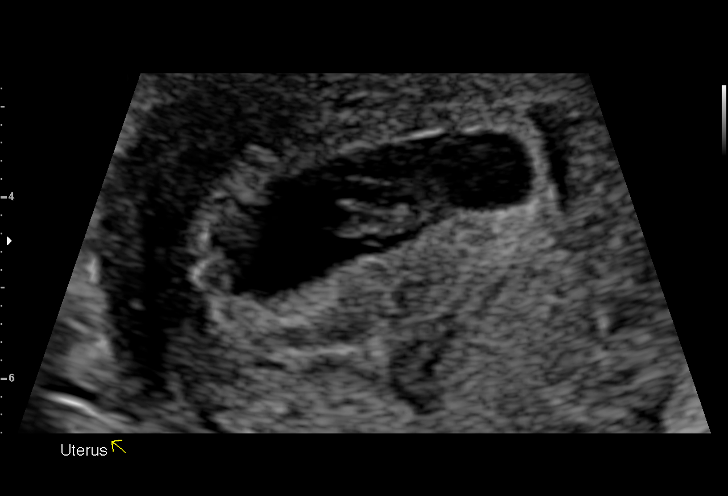
[im 19/50]
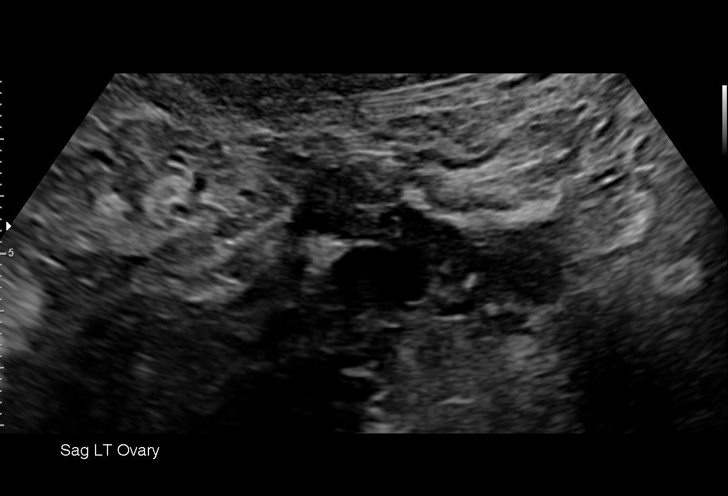
[im 22/50]
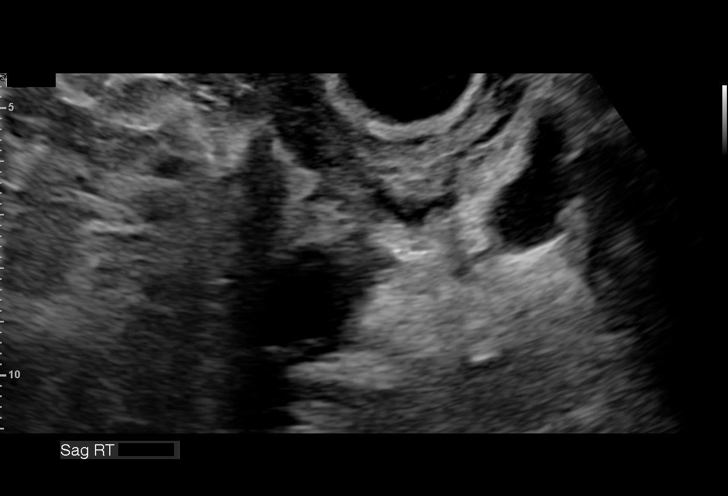
[im 26/50]
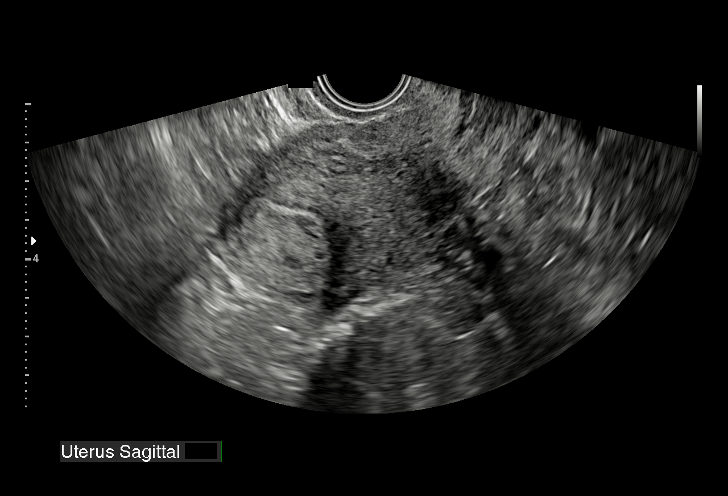
[im 28/50]
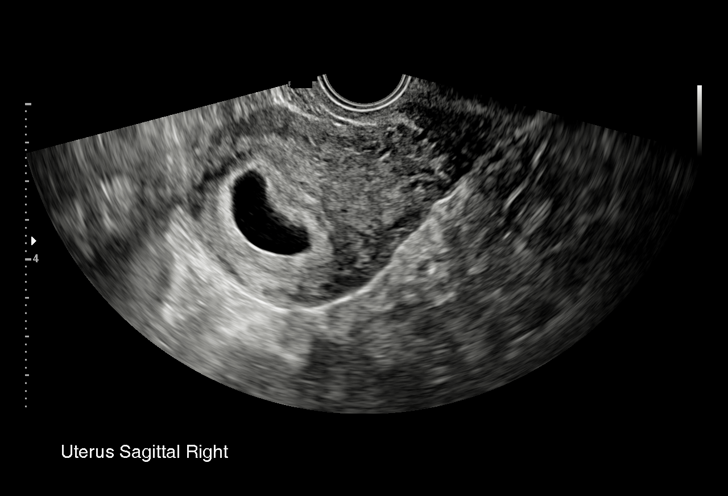
[im 31/50]
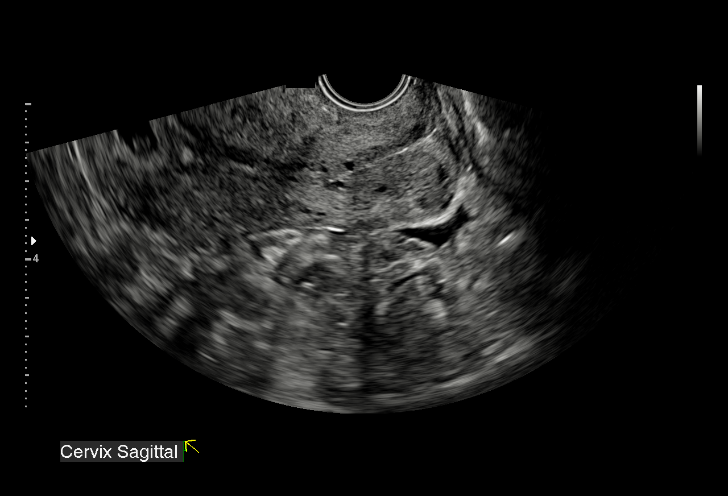
[im 35/50]
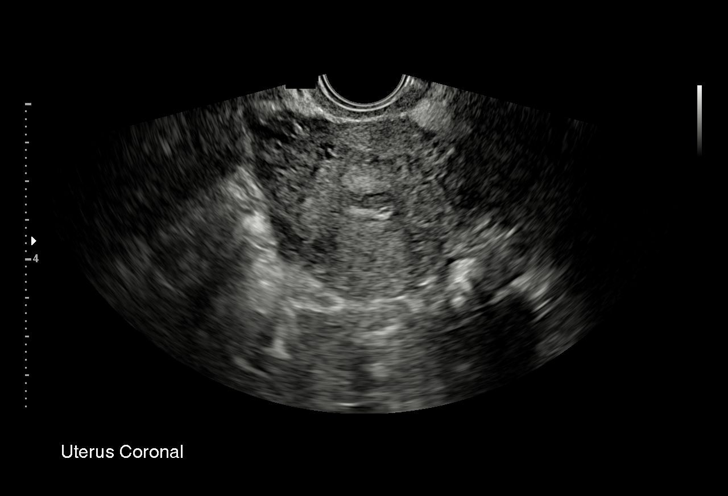
[im 39/50]
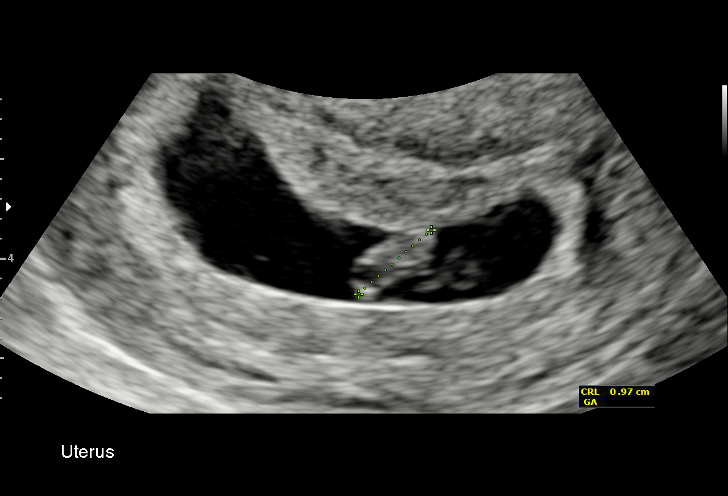
[im 42/50]
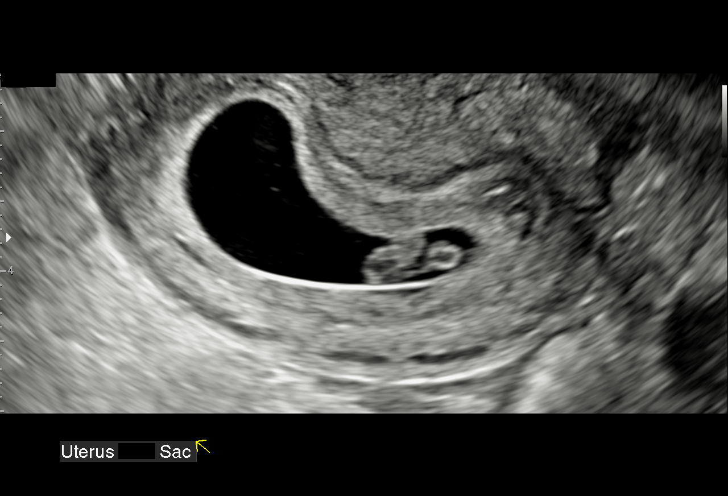
[im 46/50]
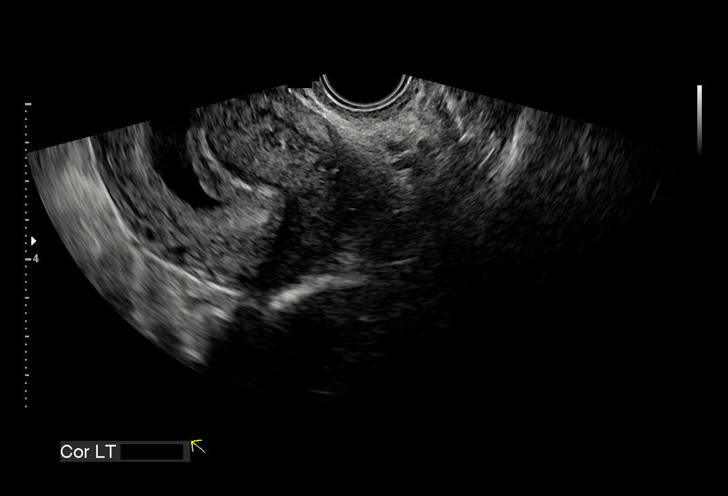
[im 50/50]
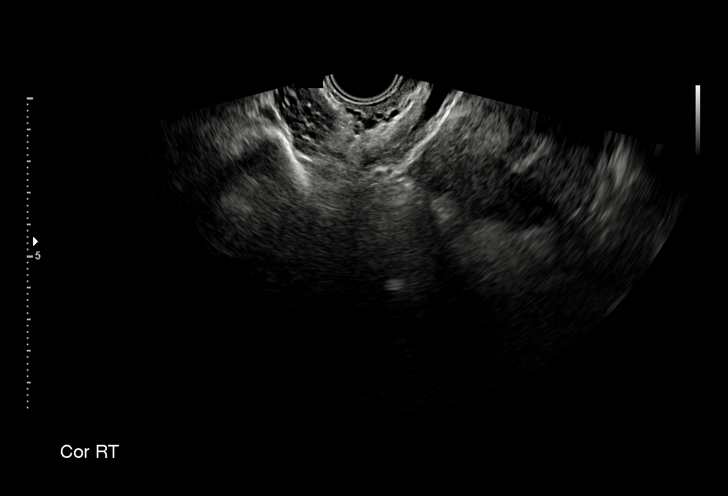

[15 of 28 positions shown; findings below may reference images not displayed]

FINDINGS: Intrauterine gestational sac: Single intrauterine gestational sac.

Yolk sac:  Seen

Embryo:  Present

Cardiac Activity: Detected

Heart Rate: 144  bpm

CRL:  10  mm   7 w   0 d                  US EDC: 05/09/2018

Subchorionic hemorrhage:  None visualized.

Maternal uterus/adnexae: The right ovary is not visualized. The left
ovary is unremarkable.
IMPRESSION: Single live intrauterine pregnancy with an estimated gestational age
of 7 weeks, 0 days concordant with LMP.

## 2019-01-01 ENCOUNTER — Encounter (HOSPITAL_BASED_OUTPATIENT_CLINIC_OR_DEPARTMENT_OTHER): Payer: Self-pay | Admitting: Emergency Medicine

## 2019-01-01 ENCOUNTER — Other Ambulatory Visit: Payer: Self-pay

## 2019-01-01 ENCOUNTER — Emergency Department (HOSPITAL_BASED_OUTPATIENT_CLINIC_OR_DEPARTMENT_OTHER)
Admission: EM | Admit: 2019-01-01 | Discharge: 2019-01-01 | Disposition: A | Discharge status: Home / Self Care | Payer: Medicaid Other | Attending: Emergency Medicine | Admitting: Emergency Medicine

## 2019-01-01 DIAGNOSIS — Z87891 Personal history of nicotine dependence: Secondary | ICD-10-CM | POA: Diagnosis not present

## 2019-01-01 DIAGNOSIS — M5416 Radiculopathy, lumbar region: Secondary | ICD-10-CM | POA: Insufficient documentation

## 2019-01-01 DIAGNOSIS — M545 Low back pain: Secondary | ICD-10-CM | POA: Diagnosis present

## 2019-01-01 MED ORDER — PREDNISONE 10 MG (21) PO TBPK: ORAL_TABLET | ORAL | 0 refills | Status: DC

## 2019-01-01 MED ORDER — DEXAMETHASONE SODIUM PHOSPHATE 10 MG/ML IJ SOLN
10.0000 mg | Freq: Once | INTRAMUSCULAR | Status: AC
  Administered 2019-01-01: 10 mg via INTRAMUSCULAR
  Filled 2019-01-01: qty 1

## 2019-01-01 MED ORDER — IBUPROFEN 600 MG PO TABS: 600.0000 mg | ORAL_TABLET | Freq: Four times a day (QID) | ORAL | 0 refills | Status: DC | PRN

## 2019-01-01 MED ORDER — HYDROCODONE-ACETAMINOPHEN 5-325 MG PO TABS: 1.0000 | ORAL_TABLET | ORAL | 0 refills | Status: DC | PRN

## 2019-01-01 MED ORDER — DIAZEPAM 5 MG PO TABS: 5.0000 mg | ORAL_TABLET | Freq: Two times a day (BID) | ORAL | 0 refills | Status: DC

## 2019-01-01 MED ORDER — KETOROLAC TROMETHAMINE 30 MG/ML IJ SOLN
30.0000 mg | Freq: Once | INTRAMUSCULAR | Status: AC
  Administered 2019-01-01: 30 mg via INTRAMUSCULAR
  Filled 2019-01-01: qty 1

## 2019-01-01 MED ORDER — DIAZEPAM 5 MG PO TABS
5.0000 mg | ORAL_TABLET | Freq: Once | ORAL | Status: AC
  Administered 2019-01-01: 5 mg via ORAL
  Filled 2019-01-01: qty 1

## 2019-01-01 NOTE — ED Triage Notes (Signed)
Reports back pain that is radiating to right leg.

## 2019-01-01 NOTE — ED Provider Notes (Signed)
[place] EMERGENCY DEPARTMENT Provider Note   CSN: [id number] Arrival date & time: 01/01/19  1038    History   Chief Complaint Chief Complaint  Patient presents with  . Back Pain    HPI Krystal Garrett is a 28 y.o. female.     Pt presents to the ED today with right sided back pain.  She feels a "knot" in her back and pain radiates down her right leg.  Sometimes it gives out.  She has a 48 month old and it's getting painful to pick her up.  She denies bowel or bladder problems.  She is not breast feeding.     Past Medical History:  Diagnosis Date  . Anxiety   . Depression   . Gallstone   . Kidney stone   . OCD (obsessive compulsive disorder)     Patient Active Problem List   Diagnosis Date Noted  . Unwanted fertility 04/15/2018  . Skin growth 03/13/2018  . Carpal tunnel syndrome 03/13/2018  . Sinusitis 12/20/2017  . Migraines 11/21/2017  . ASCUS with positive high risk HPV cervical 11/02/2017    Past Surgical History:  Procedure Laterality Date  . TONSILLECTOMY AND ADENOIDECTOMY     as a child     OB History    Gravida  3   Para  2   Term  2   Preterm  0   AB  1   Living  2     SAB  1   TAB  0   Ectopic  0   Multiple  0   Live Births  2            Home Medications    Prior to Admission medications   Medication Sig Start Date End Date Taking? Authorizing Provider  diazepam (VALIUM) 5 MG tablet Take 1 tablet (5 mg total) by mouth 2 (two) times daily. 01/01/19   Isla Pence, MD  HYDROcodone-acetaminophen (NORCO/VICODIN) 5-325 MG tablet Take 1 tablet by mouth every 4 (four) hours as needed. 01/01/19   Isla Pence, MD  ibuprofen (ADVIL,MOTRIN) 600 MG tablet Take 1 tablet (600 mg total) by mouth every 6 (six) hours as needed. 01/01/19   Isla Pence, MD  predniSONE (STERAPRED UNI-PAK 21 TAB) 10 MG (21) TBPK tablet Take 6 tabs for 2 days, then 5 for 2 days, then 4 for 2 days, then 3 for 2 days, 2 for 2 days, then 1 for  2 days 01/01/19   Isla Pence, MD    Family History Family History  Problem Relation Age of Onset  . Hypertension Mother   . Diabetes Mother   . Hypertension Father   . Cancer Maternal Grandmother   . Diabetes Maternal Grandmother   . Hypertension Maternal Grandmother     Social History Social History   Tobacco Use  . Smoking status: Former Smoker    Packs/day: 0.25    Types: Cigarettes  . Smokeless tobacco: Never Used  Substance Use Topics  . Alcohol use: No  . Drug use: No     Allergies   Patient has no known allergies.   Review of Systems Review of Systems  Musculoskeletal: Positive for back pain.  All other systems reviewed and are negative.    Physical Exam Updated Vital Signs BP 124/79 (BP Location: Right Arm)   Pulse 81   Temp 98.3 F (36.8 C) (Oral)   Ht 5\' 6"  (1.676 m)   Wt 77.1 kg   LMP 12/18/2018 (Approximate)  SpO2 100%   Breastfeeding No   BMI 27.44 kg/m   Physical Exam Vitals signs and nursing note reviewed.  Constitutional:      Appearance: Normal appearance.  HENT:     Head: Normocephalic and atraumatic.     Right Ear: External ear normal.     Left Ear: External ear normal.     Nose: Nose normal.     Mouth/Throat:     Mouth: Mucous membranes are moist.  Eyes:     Extraocular Movements: Extraocular movements intact.     Pupils: Pupils are equal, round, and reactive to light.  Neck:     Musculoskeletal: Normal range of motion and neck supple.  Cardiovascular:     Rate and Rhythm: Normal rate and regular rhythm.     Pulses: Normal pulses.     Heart sounds: Normal heart sounds.  Pulmonary:     Effort: Pulmonary effort is normal.     Breath sounds: Normal breath sounds.  Abdominal:     General: Abdomen is flat.  Musculoskeletal:       Back:  Skin:    General: Skin is warm.     Capillary Refill: Capillary refill takes less than 2 seconds.  Neurological:     General: No focal deficit present.     Mental Status: She is  alert and oriented to person, place, and time.  Psychiatric:        Mood and Affect: Mood normal.        Behavior: Behavior normal.      ED Treatments / Results  Labs (all labs ordered are listed, but only abnormal results are displayed) Labs Reviewed - No data to display  EKG None  Radiology No results found.  Procedures Procedures (including critical care time)  Medications Ordered in ED Medications  dexamethasone (DECADRON) injection 10 mg (10 mg Intramuscular Given 01/01/19 1114)  ketorolac (TORADOL) 30 MG/ML injection 30 mg (30 mg Intramuscular Given 01/01/19 1114)  diazepam (VALIUM) tablet 5 mg (5 mg Oral Given 01/01/19 1114)     Initial Impression / Assessment and Plan / ED Course  I have reviewed the triage vital signs and the nursing notes.  Pertinent labs & imaging results that were available during my care of the patient were reviewed by me and considered in my medical decision making (see chart for details).        Pt's sx c/w lumbar radiculopathy.  She is given toradol/decadron/valium in ED.  She will be d/c home with prednisone, ibuprofen, lortab, and valium.  Return if worse.  F/u with pcp.  Final Clinical Impressions(s) / ED Diagnoses   Final diagnoses:  Lumbar radiculopathy    ED Discharge Orders         Ordered    predniSONE (STERAPRED UNI-PAK 21 TAB) 10 MG (21) TBPK tablet     01/01/19 1116    ibuprofen (ADVIL,MOTRIN) 600 MG tablet  Every 6 hours PRN     01/01/19 1116    HYDROcodone-acetaminophen (NORCO/VICODIN) 5-325 MG tablet  Every 4 hours PRN     01/01/19 1116    diazepam (VALIUM) 5 MG tablet  2 times daily     01/01/19 1116           Isla Pence, MD 01/01/19 1117

## 2019-03-18 ENCOUNTER — Other Ambulatory Visit: Payer: Self-pay

## 2019-03-18 ENCOUNTER — Other Ambulatory Visit (HOSPITAL_COMMUNITY)
Admission: RE | Admit: 2019-03-18 | Discharge: 2019-03-18 | Disposition: A | Discharge status: Home / Self Care | Payer: Medicaid Other | Source: Ambulatory Visit | Attending: Obstetrics & Gynecology | Admitting: Obstetrics & Gynecology

## 2019-03-18 ENCOUNTER — Ambulatory Visit (INDEPENDENT_AMBULATORY_CARE_PROVIDER_SITE_OTHER): Payer: Medicaid Other | Admitting: *Deleted

## 2019-03-18 VITALS — BP 121/74 | HR 67 | Temp 98.1°F | Ht 66.0 in | Wt 182.5 lb

## 2019-03-18 DIAGNOSIS — N898 Other specified noninflammatory disorders of vagina: Secondary | ICD-10-CM | POA: Diagnosis present

## 2019-03-18 NOTE — Progress Notes (Signed)
Pt presents for self swab.  Pt states that for 2 months she has been experiencing a chunky yellow/green discharge with cramping and pelvic pain.  Pt states the cramping and pelvic pain come and go.  She states she treated for yeast with OTC Monistat but it did not help.  Pt states that her periods have also been irregular the last 2 months and they have been coming early, been unusually heavy with clots. Pt states that she has not had sex since she delivered her baby June 30,2019.  Pt states she is concerned it's vaginosis. Advised pt that she swab performed today would detect bacterial vaginosis, yeast, tric, gonorrhea, and chlamydia.  Pt instructed on how to perform a self swab.  Pt advised that the results can take 24-48 hours to come back and she would be contacted with abnormal results.  Pt verbalized understanding.

## 2019-03-19 ENCOUNTER — Telehealth: Payer: Self-pay | Admitting: Lactation Services

## 2019-03-19 LAB — CERVICOVAGINAL ANCILLARY ONLY
Bacterial vaginitis: NEGATIVE
Candida vaginitis: NEGATIVE
Chlamydia: NEGATIVE
Neisseria Gonorrhea: NEGATIVE
Trichomonas: NEGATIVE

## 2019-03-19 NOTE — Telephone Encounter (Addendum)
Pt called and LM on nurse voice mail.   Pt reports she was calling to get her test results. Pt reports she has been unable to get into MyChart. Pt is requesting a call back with results.   Called pt back at 1:12 pm and let her know that her labs have not been resulted yet. Informed pt that if she does not hear back that would indicate that her labs are normal, she will be notified if abnormal. Pt voiced understanding.

## 2019-03-24 ENCOUNTER — Telehealth: Payer: Self-pay

## 2019-03-24 NOTE — Telephone Encounter (Signed)
Pt called Nurse line today at 1:09p requesting test results from Wet Prep that was done on 03/18/19, wants call back @ [phone].

## 2019-03-24 NOTE — Telephone Encounter (Signed)
Called pt about Nurse line call regarding test results on 03/18/19.Advised everything came back Negative. Pt states is still  having pain, advised need to make appt. Pt verbalized understanding.

## 2019-07-21 ENCOUNTER — Telehealth: Payer: Self-pay | Admitting: Obstetrics & Gynecology

## 2019-07-21 NOTE — Telephone Encounter (Signed)
The patient called in to schedule her annual. Informed the patient of Dr. Ihor Dow completing virtual visits. She stated she will see a different doctor for this visit however all future visits are to be Dr. Ihor Dow.

## 2019-08-21 ENCOUNTER — Ambulatory Visit: Payer: Medicaid Other | Admitting: Obstetrics and Gynecology

## 2019-09-04 ENCOUNTER — Ambulatory Visit: Payer: Medicaid Other | Admitting: Obstetrics and Gynecology

## 2019-09-07 ENCOUNTER — Encounter: Payer: Self-pay | Admitting: Family Medicine

## 2019-09-07 ENCOUNTER — Ambulatory Visit (INDEPENDENT_AMBULATORY_CARE_PROVIDER_SITE_OTHER): Payer: Medicaid Other | Admitting: Family Medicine

## 2019-09-07 ENCOUNTER — Other Ambulatory Visit (HOSPITAL_COMMUNITY)
Admission: RE | Admit: 2019-09-07 | Discharge: 2019-09-07 | Disposition: A | Discharge status: Home / Self Care | Payer: Medicaid Other | Source: Ambulatory Visit | Attending: Obstetrics and Gynecology | Admitting: Obstetrics and Gynecology

## 2019-09-07 ENCOUNTER — Other Ambulatory Visit: Payer: Self-pay

## 2019-09-07 VITALS — BP 125/88 | HR 83 | Wt 186.6 lb

## 2019-09-07 DIAGNOSIS — Z01419 Encounter for gynecological examination (general) (routine) without abnormal findings: Secondary | ICD-10-CM | POA: Diagnosis not present

## 2019-09-07 DIAGNOSIS — Z0000 Encounter for general adult medical examination without abnormal findings: Secondary | ICD-10-CM | POA: Diagnosis not present

## 2019-09-07 DIAGNOSIS — K649 Unspecified hemorrhoids: Secondary | ICD-10-CM

## 2019-09-07 DIAGNOSIS — N939 Abnormal uterine and vaginal bleeding, unspecified: Secondary | ICD-10-CM

## 2019-09-07 NOTE — Progress Notes (Signed)
GYNECOLOGY ANNUAL PREVENTATIVE CARE ENCOUNTER NOTE  Subjective:   Krystal Garrett is a 28 y.o. [phone] female here for a routine annual gynecologic exam.  Current complaints: heavy vaginal bleeding, regular, about every 21-24 days, 7 day bleeding. Tried OCPs, but triggered her bipolar.  Denies abnormal vaginal bleeding, discharge, pelvic pain, problems with intercourse or other gynecologic concerns.    Gynecologic History Patient's last menstrual period was 08/24/2019 (exact date). Patient is not sexually active  Contraception: abstinence Last Pap: 2019. 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  Obstetric History OB History  Gravida Para Term Preterm AB Living  3 2 2  0 1 2  SAB TAB Ectopic Multiple Live Births  1 0 0 0 2    # Outcome Date GA Lbr Len/2nd Weight Sex Delivery Anes PTL Lv  3 Term 05/04/18 [redacted]w[redacted]d 05:12 / 01:07 8 lb 14.5 oz (4.04 kg) F Vag-Spont EPI  LIV  2 SAB 05/2015          1 Term 10/27/09 [redacted]w[redacted]d    Vag-Spont   LIV    Past Medical History:  Diagnosis Date   Anxiety    Depression    Gallstone    Kidney stone    OCD (obsessive compulsive disorder)     Past Surgical History:  Procedure Laterality Date   TONSILLECTOMY AND ADENOIDECTOMY     as a child    No current outpatient medications on file prior to visit.   No current facility-administered medications on file prior to visit.     No Known Allergies  Social History   Socioeconomic History   Marital status: Single    Spouse name: Not on file   Number of children: Not on file   Years of education: Not on file   Highest education level: Not on file  Occupational History   Not on file  Social Needs   Financial resource strain: Not on file   Food insecurity    Worry: Not on file    Inability: Not on file   Transportation needs    Medical: Not on file    Non-medical: Not on file  Tobacco Use   Smoking status: Former Smoker   Packs/day: 0.25    Types: Cigarettes   Smokeless tobacco: Never Used  Substance and Sexual Activity   Alcohol use: No   Drug use: No   Sexual activity: Yes    Birth control/protection: None  Lifestyle   Physical activity    Days per week: Not on file    Minutes per session: Not on file   Stress: Not on file  Relationships   Social connections    Talks on phone: Not on file    Gets together: Not on file    Attends religious service: Not on file    Active member of club or organization: Not on file    Attends meetings of clubs or organizations: Not on file    Relationship status: Not on file   Intimate partner violence    Fear of current or ex partner: Not on file    Emotionally abused: Not on file    Physically abused: Not on file    Forced sexual activity: Not on file  Other Topics Concern   Not on file  Social History Narrative   Not on file    Family History  Problem Relation Age of Onset   Hypertension Mother    Diabetes Mother  Hypertension Father    Cancer Maternal Grandmother    Diabetes Maternal Grandmother    Hypertension Maternal Grandmother     The following portions of the patient's history were reviewed and updated as appropriate: allergies, current medications, past family history, past medical history, past social history, past surgical history and problem list.  Review of Systems Pertinent items are noted in HPI.   Objective:  BP 125/88    Pulse 83    Wt 186 lb 9.6 oz (84.6 kg)    LMP 08/24/2019 (Exact Date)    BMI 30.12 kg/m  Wt Readings from Last 3 Encounters:  09/07/19 186 lb 9.6 oz (84.6 kg)  03/18/19 182 lb 8 oz (82.8 kg)  01/01/19 170 lb (77.1 kg)     CONSTITUTIONAL: Well-developed, well-nourished female in no acute distress.  HENT:  Normocephalic, atraumatic, External right and left ear normal. Oropharynx is clear and moist EYES: Conjunctivae and EOM are normal. Pupils are equal, round, and reactive to light. No scleral  icterus.  NECK: Normal range of motion, supple, no masses.  Normal thyroid.   CARDIOVASCULAR: Normal heart rate noted, regular rhythm RESPIRATORY: Clear to auscultation bilaterally. Effort and breath sounds normal, no problems with respiration noted. BREASTS: declined exam. ABDOMEN: Soft, normal bowel sounds, no distention noted.  No tenderness, rebound or guarding.  PELVIC: Normal appearing external genitalia; normal appearing vaginal mucosa and cervix.  No abnormal discharge noted.  Normal uterine size, no other palpable masses, no uterine or adnexal tenderness. MUSCULOSKELETAL: Normal range of motion. No tenderness.  No cyanosis, clubbing, or edema.  2+ distal pulses. SKIN: Skin is warm and dry. No rash noted. Not diaphoretic. No erythema. No pallor. NEUROLOGIC: Alert and oriented to person, place, and time. Normal reflexes, muscle tone coordination. No cranial nerve deficit noted. PSYCHIATRIC: Normal mood and affect. Normal behavior. Normal judgment and thought content.  Assessment:  Annual gynecologic examination with pap smear   Plan:  1. Well Woman Exam Will follow up results of pap smear and manage accordingly. STD testing discussed. Patient requested testing Schedule [facility] - Cytology - PAP( [place]) - HIV Antibody (routine testing w rflx) - Hepatitis B Surface AntiGEN - Hepatitis C Antibody - RPR - Cervicovaginal ancillary only( [place])   2. Abnormal uterine bleeding (AUB) Discussed IUD vs TXA vs surgery. Patient would like to try IUD. Will schedule after PAP and colposcopy.  3. Hemorrhoids, unspecified hemorrhoid type Continue with preparation H BID. Patient to see PCP to get referral for banding.   Routine preventative health maintenance measures emphasized. Please refer to After Visit Summary for other counseling recommendations.    Loma Boston, [place] for [employer]

## 2019-09-08 LAB — CERVICOVAGINAL ANCILLARY ONLY
Bacterial Vaginitis (gardnerella): NEGATIVE
Candida Glabrata: NEGATIVE
Candida Vaginitis: NEGATIVE
Chlamydia: NEGATIVE
Comment: NEGATIVE
Comment: NEGATIVE
Comment: NEGATIVE
Comment: NEGATIVE
Comment: NEGATIVE
Comment: NORMAL
Neisseria Gonorrhea: NEGATIVE
Trichomonas: NEGATIVE

## 2019-09-08 LAB — RPR: RPR Ser Ql: NONREACTIVE

## 2019-09-08 LAB — HIV ANTIBODY (ROUTINE TESTING W REFLEX): HIV Screen 4th Generation wRfx: NONREACTIVE

## 2019-09-08 LAB — HEPATITIS B SURFACE ANTIGEN: Hepatitis B Surface Ag: NEGATIVE

## 2019-09-08 LAB — HEPATITIS C ANTIBODY: Hep C Virus Ab: <0.1 s/co ratio (ref 0.0–0.9)

## 2019-09-09 LAB — CYTOLOGY - PAP: Diagnosis: NEGATIVE

## 2019-09-28 ENCOUNTER — Other Ambulatory Visit: Payer: Self-pay

## 2019-09-28 ENCOUNTER — Encounter: Payer: Self-pay | Admitting: Family Medicine

## 2019-09-28 ENCOUNTER — Ambulatory Visit (INDEPENDENT_AMBULATORY_CARE_PROVIDER_SITE_OTHER): Payer: Medicaid Other | Admitting: Family Medicine

## 2019-09-28 VITALS — BP 147/89 | HR 91 | Wt 185.0 lb

## 2019-09-28 DIAGNOSIS — Z30430 Encounter for insertion of intrauterine contraceptive device: Secondary | ICD-10-CM | POA: Diagnosis not present

## 2019-09-28 DIAGNOSIS — N939 Abnormal uterine and vaginal bleeding, unspecified: Secondary | ICD-10-CM | POA: Diagnosis not present

## 2019-09-28 LAB — POCT PREGNANCY, URINE: Preg Test, Ur: NEGATIVE

## 2019-09-28 MED ORDER — LEVONORGESTREL 19.5 MCG/DAY IU IUD
INTRAUTERINE_SYSTEM | Freq: Once | INTRAUTERINE | Status: AC
  Administered 2019-09-28: 15:00:00 via INTRAUTERINE

## 2019-09-28 NOTE — Progress Notes (Signed)
Patient here for IUD placement due to AUB  IUD Procedure Note Patient identified, informed consent performed, signed copy in chart, time out was performed.  Urine pregnancy test negative.  Speculum placed in the vagina.  Cervix visualized.  Cleaned with Betadine x 2.  Grasped anteriorly with a single tooth tenaculum.  Uterus sounded to 8 cm.  Liletta  IUD placed per manufacturer's recommendations.  Strings trimmed to 3 cm. Tenaculum was removed, good hemostasis noted.  Patient tolerated procedure well.   Patient given post procedure instructions and Liletta care card with expiration date.  Patient is asked to check IUD strings periodically and follow up in 4-6 weeks for IUD check.

## 2019-09-28 NOTE — Patient Instructions (Signed)

## 2019-10-05 ENCOUNTER — Encounter: Payer: Self-pay | Admitting: *Deleted

## 2019-10-20 ENCOUNTER — Ambulatory Visit: Payer: Medicaid Other | Admitting: Family Medicine

## 2019-10-27 ENCOUNTER — Ambulatory Visit: Payer: Medicaid Other | Admitting: Obstetrics and Gynecology

## 2021-04-13 NOTE — Telephone Encounter (Signed)
Opened in error

## 2022-09-12 ENCOUNTER — Encounter: Payer: Self-pay | Admitting: Obstetrics and Gynecology

## 2022-09-12 ENCOUNTER — Other Ambulatory Visit (HOSPITAL_COMMUNITY)
Admission: RE | Admit: 2022-09-12 | Discharge: 2022-09-12 | Disposition: A | Discharge status: Home / Self Care | Payer: Medicaid Other | Source: Ambulatory Visit | Attending: Obstetrics and Gynecology | Admitting: Obstetrics and Gynecology

## 2022-09-12 ENCOUNTER — Other Ambulatory Visit: Payer: Self-pay

## 2022-09-12 ENCOUNTER — Ambulatory Visit (INDEPENDENT_AMBULATORY_CARE_PROVIDER_SITE_OTHER): Payer: Medicaid Other | Admitting: Obstetrics and Gynecology

## 2022-09-12 VITALS — BP 132/101 | HR 68 | Ht 66.0 in | Wt 188.6 lb

## 2022-09-12 DIAGNOSIS — Z30432 Encounter for removal of intrauterine contraceptive device: Secondary | ICD-10-CM | POA: Diagnosis not present

## 2022-09-12 DIAGNOSIS — Z01419 Encounter for gynecological examination (general) (routine) without abnormal findings: Secondary | ICD-10-CM | POA: Diagnosis not present

## 2022-09-12 DIAGNOSIS — N644 Mastodynia: Secondary | ICD-10-CM | POA: Diagnosis not present

## 2022-09-12 DIAGNOSIS — R030 Elevated blood-pressure reading, without diagnosis of hypertension: Secondary | ICD-10-CM

## 2022-09-12 DIAGNOSIS — R6889 Other general symptoms and signs: Secondary | ICD-10-CM | POA: Diagnosis not present

## 2022-09-12 NOTE — Patient Instructions (Signed)
Follow up with PCP for elevated blood pressure.

## 2022-09-12 NOTE — Progress Notes (Unsigned)
ANNUAL EXAM Patient name: Krystal Garrett MRN [id number]  Date of birth: 06/10/1991 Chief Complaint:   Gynecologic Exam  History of Present Illness:   Krystal Garrett is a 31 y.o. [id number] being seen today for a routine annual exam.   Left breast has an achy pain that shoots to the front. 4 months. Nothing that started or occurred at that time. Tender to touch, daily. No noticeable skin changes (has had more breakouts of 'zits' or rash; litte red bumps on chest). "Breakouts" are usually itchy  - whole thing ha been itchy. Not currently sexually active at this time. She has had weight gain, depressed mood and also feels her skin has become more dry.  Liletta IUD placed in 2020.  Also desires IUD removal. Is not sure if her IUD is causing her skin to break out, weight gain and mood sings but feels she is perseverating on the idea of a foreign object inside. Not currently sexually active and declines other hormonal contraceptives at this time.   No LMP recorded. (Menstrual status: IUD).   Last pap 09/2019. Results were: NILM w/ HRHPV negative. H/O abnormal pap: yes Last mammogram: n/a Last colonoscopy: n/a     09/12/2022    1:49 PM 09/07/2019   11:01 AM 08/25/2018    4:03 PM 04/23/2018    3:23 PM 04/15/2018    4:28 PM  Depression screen PHQ 2/9  Decreased Interest '1 1 1 1 1  '$ Down, Depressed, Hopeless 1 1 0 1 1  PHQ - 2 Score '2 2 1 2 2  '$ Altered sleeping 3 0 '1 3 2  '$ Tired, decreased energy 0 1 0 3 2  Change in appetite 1 0 0 0 0  Feeling bad or failure about yourself  1 0 0 0 0  Trouble concentrating 1 0 1 0 0  Moving slowly or fidgety/restless 0 0 0 0 0  Suicidal thoughts 0 0 0 0 0  PHQ-9 Score '8 3 3 8 6        '$ 09/12/2022    1:49 PM 09/07/2019   11:02 AM 08/25/2018    4:03 PM 04/23/2018    3:23 PM  GAD 7 : Generalized Anxiety Score  Nervous, Anxious, on Edge '1 2 2 2  '$ Control/stop worrying '1 1 1 2  '$ Worry too much - different things '2 2 1 2  '$ Trouble relaxing 2 0 1 2  Restless 2  0 1 3  Easily annoyed or irritable '2 1 1 3  '$ Afraid - awful might happen 1 0 0 2  Total GAD 7 Score '11 6 7 16     '$ Review of Systems:   Pertinent items are noted in HPI Denies any headaches, blurred vision, fatigue, shortness of breath, chest pain, abdominal pain, abnormal vaginal discharge/itching/odor/irritation, problems with periods, bowel movements, urination, or intercourse unless otherwise stated above. Pertinent History Reviewed:  Reviewed past medical,surgical, social and family history.  Reviewed problem list, medications and allergies. Physical Assessment:   Vitals:   09/12/22 1333  BP: (!) 132/101  Pulse: 68  Weight: 188 lb 9.6 oz (85.5 kg)  Height: '5\' 6"'$  (1.676 m)  Body mass index is 30.44 kg/m.        Physical Examination:   General appearance - well appearing, and in no distress  Mental status - alert, oriented to person, place, and time  Psych:  She has a normal mood and affect  Skin - warm and dry, normal color, no suspicious lesions noted  Chest - effort normal, all lung fields clear to auscultation bilaterally, scattered slightly erythematous rash on chest midline , not extending on lateral breast tissue or in any dermatomal fashion  Heart - normal rate and regular rhythm  Breasts - breasts appear normal, no suspicious masses, no skin or nipple changes or axillary nodes, mild tenderness without associated skin changes  Abdomen - soft, nontender, nondistended, no masses or organomegaly  Pelvic -  VULVA: normal appearing vulva with no masses, tenderness or lesions VAGINA: normal appearing vagina with normal color and discharge, no lesions   CERVIX: normal appearing cervix without discharge or lesions, no CMT  Thin prep pap is done with HR HPV cotesting  UTERUS: uterus is felt to be normal size, shape, consistency and nontender   ADNEXA: No adnexal masses or tenderness noted.  Extremities:  No swelling or varicosities noted  IUD Removal  Patient identified,  informed consent performed, consent signed.  Patient was in the dorsal lithotomy position, normal external genitalia was noted.  A speculum was placed in the patient's vagina, normal discharge was noted, no lesions. The cervix was visualized, no lesions, no abnormal discharge.  The strings of the IUD were visualized, grasped and pulled using ring forceps. The IUD was removed in its entirety. . Patient tolerated the procedure well.    Patient will use condoms for contraception  Routine preventative health maintenance measures emphasized.  Chaperone present for exam  Assessment & Plan:  1. Well woman exam with routine gynecological exam Pap completed today and now s/p  - Cytology - PAP( [facility])  2. Elevated blood pressure reading Discussed importance of follow up with PCP re: HTN - Ambulatory referral to [facility]  3. Breast pain No gross changes to breast, poss alternative source would be costochondritis. Recommend NSAID therapy to start. If no improvement or worsens, will plan fr breast imaging.   4. Temperature intolerance Unclear if true intolerance per history but patient reports family history of thyroid issues and symptoms may be related to thyroid dysfunction.  - TSH   Orders Placed This Encounter  Procedures   TSH   Ambulatory referral to Family Practice    Meds: No orders of the defined types were placed in this encounter.   Follow-up: No follow-ups on file.  Darliss Cheney, MD 09/13/2022 1:29 PM

## 2022-09-13 LAB — TSH: TSH: 2.81 u[IU]/mL (ref 0.450–4.500)

## 2022-09-14 LAB — CYTOLOGY - PAP
Comment: NEGATIVE
Diagnosis: NEGATIVE
High risk HPV: NEGATIVE

## 2024-11-23 ENCOUNTER — Encounter (HOSPITAL_BASED_OUTPATIENT_CLINIC_OR_DEPARTMENT_OTHER): Payer: Self-pay

## 2024-11-23 ENCOUNTER — Emergency Department (HOSPITAL_BASED_OUTPATIENT_CLINIC_OR_DEPARTMENT_OTHER)
Admission: EM | Admit: 2024-11-23 | Discharge: 2024-11-23 | Disposition: A | Discharge status: Home / Self Care | Attending: Emergency Medicine | Admitting: Emergency Medicine

## 2024-11-23 ENCOUNTER — Other Ambulatory Visit: Payer: Self-pay

## 2024-11-23 DIAGNOSIS — J0110 Acute frontal sinusitis, unspecified: Secondary | ICD-10-CM | POA: Insufficient documentation

## 2024-11-23 DIAGNOSIS — R059 Cough, unspecified: Secondary | ICD-10-CM | POA: Diagnosis present

## 2024-11-23 LAB — RESP PANEL BY RT-PCR (RSV, FLU A&B, COVID)  RVPGX2
Influenza A by PCR: NEGATIVE
Influenza B by PCR: NEGATIVE
Resp Syncytial Virus by PCR: NEGATIVE
SARS Coronavirus 2 by RT PCR: NEGATIVE

## 2024-11-23 MED ORDER — AMOXICILLIN-POT CLAVULANATE 875-125 MG PO TABS: 1.0000 | ORAL_TABLET | Freq: Two times a day (BID) | ORAL | 0 refills | Status: AC

## 2024-11-23 MED ORDER — FLUCONAZOLE 150 MG PO TABS: 150.0000 mg | ORAL_TABLET | Freq: Once | ORAL | 0 refills | Status: AC

## 2024-11-23 NOTE — ED Provider Notes (Signed)
" °  [place] [facility] DEPARTMENT AT [facility] Provider Note   CSN: [id number] Arrival date & time: 11/23/24  0800     Patient presents with: Sinus pressure   Krystal Garrett is a 34 y.o. female.   The history is provided by the patient.  Patient is otherwise healthy at baseline.  She reports since Christmas she has had intermittent episodes of sinus pressure, cough, congestion and bilateral ear pain.  She feels lightheaded at times.  No fever over the past 24 hours. Her daughter has been ill with similar symptoms      Prior to Admission medications  Medication Sig Start Date End Date Taking? Authorizing Provider  amoxicillin -clavulanate (AUGMENTIN ) 875-125 MG tablet Take 1 tablet by mouth every 12 (twelve) hours. 11/23/24  Yes Midge Golas, MD  fluconazole  (DIFLUCAN ) 150 MG tablet Take 1 tablet (150 mg total) by mouth once for 1 dose. 11/23/24 11/23/24 Yes Midge Golas, MD  Multiple Vitamins-Minerals (WOMENS MULTIVITAMIN PO) Take 1 tablet by mouth daily.    [provider]    Allergies: Patient has no known allergies.    Review of Systems  HENT:  Positive for congestion.   Respiratory:  Positive for cough.     Updated Vital Signs BP 130/88 (BP Location: Left Arm)   Pulse 91   Temp 98.2 F (36.8 C) (Oral)   Resp 20   LMP 11/16/2024   SpO2 97%   Physical Exam CONSTITUTIONAL: Well developed/well nourished, no distress HEAD: Normocephalic/atraumatic EYES: EOMI/PERRL ENMT: Mucous membranes moist, no stridor, no drooling, bilateral TMs clear and intact NECK: supple no meningeal signs CV: S1/S2 noted, no murmurs/rubs/gallops noted LUNGS: Lungs are clear to auscultation bilaterally, no apparent distress NEURO: Pt is awake/alert/appropriate, moves all extremitiesx4.  No facial droop.    (all labs ordered are listed, but only abnormal results are displayed) Labs Reviewed  RESP PANEL BY RT-PCR (RSV, FLU A&B, COVID)  [id number]     EKG: None  Radiology: No results found.   Procedures   Medications Ordered in the ED - No data to display                                  Medical Decision Making Risk Prescription drug management.   This patient is well-appearing, no acute distress, vitals appropriate Reports symptoms of sinusitis with sinus pressure, ear pain, cough congestion.  Reports symptoms since last month, she has not been on antibiotics.  Will give a trial of oral antibiotics.  She also request Diflucan  to help prevent yeast infection     Final diagnoses:  Acute non-recurrent frontal sinusitis    ED Discharge Orders          Ordered    amoxicillin -clavulanate (AUGMENTIN ) 875-125 MG tablet  Every 12 hours        11/23/24 0826    fluconazole  (DIFLUCAN ) 150 MG tablet   Once        11/23/24 9173               Midge Golas, MD 11/23/24 0902  "

## 2024-11-23 NOTE — ED Notes (Signed)
 Discharge instructions reviewed with patient. Patient verbalizes understanding, no further questions at this time. Medications/prescriptions and follow up information provided. No acute distress noted at time of departure.

## 2024-11-23 NOTE — ED Triage Notes (Signed)
 Sinus pressure, BIL ear pain, cough, congestion, fever since 12/25.
